# Patient Record
Sex: Female | Born: 1980
Health system: Southern US, Community
[De-identification: ages and names within clinical notes are randomized; demographics above are authoritative.]

## PROBLEM LIST (undated history)

## (undated) DIAGNOSIS — Z87442 Personal history of urinary calculi: Secondary | ICD-10-CM

## (undated) DIAGNOSIS — IMO0002 Reserved for concepts with insufficient information to code with codable children: Secondary | ICD-10-CM

## (undated) DIAGNOSIS — R7989 Other specified abnormal findings of blood chemistry: Secondary | ICD-10-CM

## (undated) DIAGNOSIS — G43909 Migraine, unspecified, not intractable, without status migrainosus: Secondary | ICD-10-CM

## (undated) DIAGNOSIS — F419 Anxiety disorder, unspecified: Secondary | ICD-10-CM

## (undated) DIAGNOSIS — F32A Anxiety disorder, unspecified: Secondary | ICD-10-CM

## (undated) DIAGNOSIS — R87629 Unspecified abnormal cytological findings in specimens from vagina: Secondary | ICD-10-CM

## (undated) DIAGNOSIS — F1120 Opioid dependence, uncomplicated: Secondary | ICD-10-CM

## (undated) DIAGNOSIS — F329 Major depressive disorder, single episode, unspecified: Secondary | ICD-10-CM

## (undated) DIAGNOSIS — G43009 Migraine without aura, not intractable, without status migrainosus: Secondary | ICD-10-CM

## (undated) DIAGNOSIS — R87619 Unspecified abnormal cytological findings in specimens from cervix uteri: Secondary | ICD-10-CM

## (undated) HISTORY — DX: Major depressive disorder, single episode, unspecified: F32.9

## (undated) HISTORY — DX: Unspecified abnormal cytological findings in specimens from cervix uteri: R87.619

## (undated) HISTORY — PX: COLPOSCOPY: SHX161

## (undated) HISTORY — DX: Reserved for concepts with insufficient information to code with codable children: IMO0002

## (undated) HISTORY — DX: Depression, unspecified: F32.A

## (undated) HISTORY — DX: Opioid dependence, uncomplicated: F11.20

## (undated) HISTORY — DX: Migraine without aura, not intractable, without status migrainosus: G43.009

## (undated) HISTORY — DX: Personal history of urinary calculi: Z87.442

## (undated) HISTORY — DX: Anxiety disorder, unspecified: F41.9

## (undated) HISTORY — DX: Unspecified abnormal cytological findings in specimens from vagina: R87.629

## (undated) HISTORY — DX: Other specified abnormal findings of blood chemistry: R79.89

---
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
## 1999-05-25 ENCOUNTER — Other Ambulatory Visit: Admission: RE | Admit: 1999-05-25 | Discharge: 1999-05-25 | Payer: Self-pay | Admitting: *Deleted

## 2000-06-22 ENCOUNTER — Other Ambulatory Visit: Admission: RE | Admit: 2000-06-22 | Discharge: 2000-06-22 | Payer: Self-pay | Admitting: *Deleted

## 2001-05-10 ENCOUNTER — Encounter: Payer: Self-pay | Admitting: Family Medicine

## 2001-05-10 ENCOUNTER — Encounter: Admission: RE | Admit: 2001-05-10 | Discharge: 2001-05-10 | Payer: Self-pay | Admitting: Family Medicine

## 2001-07-04 ENCOUNTER — Other Ambulatory Visit: Admission: RE | Admit: 2001-07-04 | Discharge: 2001-07-04 | Payer: Self-pay | Admitting: *Deleted

## 2002-07-29 ENCOUNTER — Other Ambulatory Visit: Admission: RE | Admit: 2002-07-29 | Discharge: 2002-07-29 | Payer: Self-pay | Admitting: *Deleted

## 2003-08-12 ENCOUNTER — Other Ambulatory Visit: Admission: RE | Admit: 2003-08-12 | Discharge: 2003-08-12 | Payer: Self-pay | Admitting: *Deleted

## 2004-08-25 ENCOUNTER — Other Ambulatory Visit: Admission: RE | Admit: 2004-08-25 | Discharge: 2004-08-25 | Payer: Self-pay | Admitting: *Deleted

## 2005-09-20 ENCOUNTER — Other Ambulatory Visit: Admission: RE | Admit: 2005-09-20 | Discharge: 2005-09-20 | Payer: Self-pay | Admitting: *Deleted

## 2006-02-14 HISTORY — PX: CHOLECYSTECTOMY: SHX55

## 2006-04-06 ENCOUNTER — Emergency Department (HOSPITAL_COMMUNITY): Admission: EM | Admit: 2006-04-06 | Discharge: 2006-04-06 | Payer: Self-pay | Admitting: Emergency Medicine

## 2006-04-11 ENCOUNTER — Encounter: Admission: RE | Admit: 2006-04-11 | Discharge: 2006-04-11 | Payer: Self-pay | Admitting: Physician Assistant

## 2006-06-13 ENCOUNTER — Encounter: Admission: RE | Admit: 2006-06-13 | Discharge: 2006-06-13 | Payer: Self-pay | Admitting: Family Medicine

## 2006-08-04 ENCOUNTER — Encounter (INDEPENDENT_AMBULATORY_CARE_PROVIDER_SITE_OTHER): Payer: Self-pay | Admitting: Surgery

## 2006-08-04 ENCOUNTER — Ambulatory Visit (HOSPITAL_COMMUNITY): Admission: RE | Admit: 2006-08-04 | Discharge: 2006-08-04 | Payer: Self-pay | Admitting: Surgery

## 2006-10-25 ENCOUNTER — Other Ambulatory Visit: Admission: RE | Admit: 2006-10-25 | Discharge: 2006-10-25 | Payer: Self-pay | Admitting: *Deleted

## 2008-03-10 ENCOUNTER — Emergency Department (HOSPITAL_COMMUNITY): Admission: EM | Admit: 2008-03-10 | Discharge: 2008-03-10 | Payer: Self-pay | Admitting: Emergency Medicine

## 2010-06-29 NOTE — Op Note (Signed)
NAMEJERSEE, Lauren Harvey              ACCOUNT NO.:  [id number]   MEDICAL RECORD NO.:  [id number]          PATIENT TYPE:  AMB   LOCATION:  DAY                          FACILITY:  [facility]   PHYSICIAN:  Wilmon Arms. Corliss Skains, M.D. DATE OF BIRTH:  1980/11/15   DATE OF PROCEDURE:  08/04/2006  DATE OF DISCHARGE:                               OPERATIVE REPORT   PREOPERATIVE DIAGNOSIS:  Chronic calculus cholecystitis.   POSTOPERATIVE DIAGNOSIS:  Chronic calculus cholecystitis.   PROCEDURE PERFORMED:  Laparoscopic cholecystectomy with intraoperative  cholangiogram.   SURGEON:  Wilmon Arms. Tsuei, M.D.   ANESTHESIA:  General endotracheal.   INDICATIONS:  The patient is a 30 year old female who presents with  several months of epigastric abdominal pain.  Workup included an  ultrasound showing an 11 mm gallstone.  Blood work was normal.  HIDA  scan showed a 20% gallbladder ejection fraction.  The patient was then  referred for surgical evaluation.   DESCRIPTION OF PROCEDURE:  The patient brought to the operating room,  placed in supine position on the operating table.  After adequate level  of general anesthesia was obtained, the patient's abdomen was prepped  with Betadine and draped in the sterile fashion.  A time-out was taken  to assure proper patient, proper procedure.  The patient's umbilicus was  infiltrated with 0.25% Marcaine.  A transverse incision was made below  her umbilicus.  Dissection was carried down to the fascia which was  opened vertically.  The peritoneal cavity was bluntly entered.  A stay  suture of 0 Vicryl was placed around the fascial opening.  Pneumoperitoneum is obtained by inserting a Hasson cannula and  insufflating CO2, maintaining maximal pressure of 15 mmHg.  The  laparoscope was inserted and the patient was tilted in reverse  Trendelenburg, tilted to her left.  A 10 mm port was placed in  subxiphoid position.  Two 5 mm ports placed in right upper quadrant.  The  gallbladder was grasped with a clamp and elevated at the edge of the  liver.  We opened the peritoneum around the hilum of gallbladder.  I  dissected around the cystic duct circumferentially.  This was ligated  and clipped distally.  A small opening was created on the cystic duct  and a Cook cholangiogram catheter was threaded into the cystic duct.  A  cholangiogram was obtained which showed good flow proximally and  distally in the biliary tree with no sign of filling defects or  obstruction.  Contrast flowed easily through the duodenum.  The catheter  was removed and cystic duct was ligated, clipped and divided.  The  cystic artery was ligated with clips and divided.  A posterior branch  was identified and was also ligated.  Cautery was then used to remove  the gallbladder from the liver bed.  The gallbladder was placed in  EndoCatch sac.  We reinspected the gallbladder fossa for hemostasis.  The gallbladder was then removed through the umbilical port site.  The  stay suture was tied down to close the fascia.  Irrigation was suctioned  out of right upper quadrant and  ports were  removed as pneumoperitoneum was released.  A 4-0 Monocryl was used to  close skin incisions.  Steri-Strips and Kling dressings were applied.  The patient was then extubated and brought to recovery in stable  condition.  All sponge, instrument, needle counts correct.      Wilmon Arms. Tsuei, M.D.  Electronically Signed     MKT/MEDQ  D:  08/04/2006  T:  08/04/2006  Job:  [id number]

## 2010-12-01 LAB — DIFFERENTIAL
Lymphocytes Relative: 34
Lymphs Abs: 2.9
Monocytes Absolute: 0.5
Monocytes Relative: 6
Neutro Abs: 4.9

## 2010-12-01 LAB — CBC
HCT: 38.9
Platelets: 379
RDW: 12.7

## 2010-12-01 LAB — COMPREHENSIVE METABOLIC PANEL
Albumin: 3.5
Alkaline Phosphatase: 48
BUN: 9
Creatinine, Ser: 0.97
Potassium: 4.9
Total Protein: 7

## 2010-12-01 LAB — PREGNANCY, URINE: Preg Test, Ur: NEGATIVE

## 2011-05-30 ENCOUNTER — Other Ambulatory Visit: Payer: Self-pay | Admitting: Infectious Diseases

## 2011-05-30 ENCOUNTER — Ambulatory Visit
Admission: RE | Admit: 2011-05-30 | Discharge: 2011-05-30 | Disposition: A | Discharge status: Home / Self Care | Payer: PRIVATE HEALTH INSURANCE | Source: Ambulatory Visit | Attending: Infectious Diseases | Admitting: Infectious Diseases

## 2011-05-30 DIAGNOSIS — R7611 Nonspecific reaction to tuberculin skin test without active tuberculosis: Secondary | ICD-10-CM

## 2011-08-03 ENCOUNTER — Encounter (HOSPITAL_COMMUNITY): Payer: Self-pay | Admitting: *Deleted

## 2011-08-03 ENCOUNTER — Emergency Department (HOSPITAL_COMMUNITY)
Admission: EM | Admit: 2011-08-03 | Discharge: 2011-08-03 | Disposition: A | Discharge status: Home / Self Care | Payer: 59 | Attending: Emergency Medicine | Admitting: Emergency Medicine

## 2011-08-03 DIAGNOSIS — G43909 Migraine, unspecified, not intractable, without status migrainosus: Secondary | ICD-10-CM | POA: Insufficient documentation

## 2011-08-03 DIAGNOSIS — J019 Acute sinusitis, unspecified: Secondary | ICD-10-CM

## 2011-08-03 DIAGNOSIS — Z79899 Other long term (current) drug therapy: Secondary | ICD-10-CM | POA: Insufficient documentation

## 2011-08-03 DIAGNOSIS — F17200 Nicotine dependence, unspecified, uncomplicated: Secondary | ICD-10-CM | POA: Insufficient documentation

## 2011-08-03 DIAGNOSIS — Z885 Allergy status to narcotic agent status: Secondary | ICD-10-CM | POA: Insufficient documentation

## 2011-08-03 HISTORY — DX: Migraine, unspecified, not intractable, without status migrainosus: G43.909

## 2011-08-03 MED ORDER — FLUTICASONE PROPIONATE 50 MCG/ACT NA SUSP: 2.0000 | Freq: Every day | NASAL | Status: DC

## 2011-08-03 MED ORDER — AMOXICILLIN 500 MG PO CAPS: 500.0000 mg | ORAL_CAPSULE | Freq: Three times a day (TID) | ORAL | Status: AC

## 2011-08-03 MED ORDER — METOCLOPRAMIDE HCL 5 MG/ML IJ SOLN
10.0000 mg | Freq: Once | INTRAMUSCULAR | Status: AC
  Administered 2011-08-03: 10 mg via INTRAVENOUS
  Filled 2011-08-03: qty 2

## 2011-08-03 MED ORDER — KETOROLAC TROMETHAMINE 10 MG PO TABS: 10.0000 mg | ORAL_TABLET | Freq: Four times a day (QID) | ORAL | Status: AC | PRN

## 2011-08-03 MED ORDER — DEXAMETHASONE SODIUM PHOSPHATE 10 MG/ML IJ SOLN
10.0000 mg | Freq: Once | INTRAMUSCULAR | Status: AC
  Administered 2011-08-03: 10 mg via INTRAVENOUS
  Filled 2011-08-03: qty 1

## 2011-08-03 MED ORDER — KETOROLAC TROMETHAMINE 30 MG/ML IJ SOLN
30.0000 mg | Freq: Once | INTRAMUSCULAR | Status: AC
  Administered 2011-08-03: 30 mg via INTRAVENOUS
  Filled 2011-08-03: qty 1

## 2011-08-03 MED ORDER — DIPHENHYDRAMINE HCL 50 MG/ML IJ SOLN
25.0000 mg | Freq: Once | INTRAMUSCULAR | Status: AC
  Administered 2011-08-03: 25 mg via INTRAVENOUS
  Filled 2011-08-03: qty 1

## 2011-08-03 NOTE — ED Notes (Signed)
Pt reports headache behind right eye x 4 days. Hx of migraines. States taken imitrex at home without relief. Denies associated symptoms.

## 2011-08-03 NOTE — ED Provider Notes (Signed)
Medical screening examination/treatment/procedure(s) were conducted as a shared visit with non-physician practitioner(s) and myself.  I personally evaluated the patient during the encounter Right retroorbital ha.  No fever, neck pain, rash. Normal neuro exam.  Ha increases when she leans forward.  Dx. Sinusitis.  Will tx with abxs and steroids.  Cheri Guppy, MD 08/03/11 [phone]

## 2011-08-03 NOTE — ED Notes (Signed)
Pt reports takes suboxin and would rather not have narcotics to help with pain. Father at bedside, but does not know about this. Pt asked him to step out.

## 2011-08-03 NOTE — ED Provider Notes (Signed)
History     CSN: [id number]  Arrival date & time 08/03/11  0806   First MD Initiated Contact with Patient 08/03/11 [phone]      Chief Complaint  Patient presents with  . Headache    (Consider location/radiation/quality/duration/timing/severity/associated sxs/prior treatment) Patient is a 31 y.o. female presenting with headaches. The history is provided by the patient and a parent.  Headache  The current episode started more than 2 days ago. The problem occurs constantly. The problem has been gradually worsening. The headache is associated with nothing. The pain is located in the right unilateral region. The pain is at a severity of 7/10. Pertinent negatives include no fever and no nausea.   31 y.o female iNAD c/o headache x4 days to right periorbital region. Pt has h/o migraines byut they are usually bilateral to temporal region. Pt normally has nausea accompaning her migraines and she is not currently nauseated.  Imitrex is not working.  Pt has positional cough and runny nose. Pain is exacerbated by leaning forward. Denies acute onset exacerbation with valsalva, fever, neck stiffness, N/V, SOB.   Past Medical History  Diagnosis Date  . Migraines     Past Surgical History  Procedure Date  . Cholecystectomy     No family history on file.  History  Substance Use Topics  . Smoking status: Current Everyday Smoker  . Smokeless tobacco: Not on file  . Alcohol Use: No    OB History    Grav Para Term Preterm Abortions TAB SAB Ect Mult Living                  Review of Systems  Constitutional: Negative for fever.  Gastrointestinal: Negative for nausea.  Neurological: Positive for headaches.  All other systems reviewed and are negative.    Allergies  Dilaudid  Home Medications   Current Outpatient Rx  Name Route Sig Dispense Refill  . BUPRENORPHINE HCL-NALOXONE HCL 8-2 MG SL SUBL Sublingual Place 1 tablet under the tongue 2 (two) times daily.    Marland Kitchen FLUOXETINE HCL 40  MG PO CAPS Oral Take 40 mg by mouth daily.    Marland Kitchen LEVONORGEST-ETH ESTRAD 91-DAY 0.15-0.03 MG PO TABS Oral Take 1 tablet by mouth daily.    Carma Leaven M PLUS PO TABS Oral Take 1 tablet by mouth daily.    . SUMATRIPTAN SUCCINATE 100 MG PO TABS Oral Take 100 mg by mouth every 2 (two) hours as needed. For migraines      BP 119/72  Pulse 83  Temp 98.4 F (36.9 C) (Oral)  Resp 16  SpO2 98%  Physical Exam  Vitals reviewed. Constitutional: She is oriented to person, place, and time. She appears well-developed and well-nourished. No distress.  HENT:  Head: Normocephalic and atraumatic.  Right Ear: External ear normal.  Left Ear: External ear normal.  Nose: Nose normal.  Mouth/Throat: Oropharynx is clear and moist. No oropharyngeal exudate.       No tenderness to sinuses  Eyes: Conjunctivae and EOM are normal. Pupils are equal, round, and reactive to light.  Neck: Normal range of motion. Neck supple.  Cardiovascular: Normal rate, regular rhythm and normal heart sounds.   Pulmonary/Chest: Effort normal and breath sounds normal.  Abdominal: Soft. Bowel sounds are normal.  Musculoskeletal: Normal range of motion.  Neurological: She is alert and oriented to person, place, and time.  Psychiatric: She has a normal mood and affect.    ED Course  Procedures (including critical care time)  Labs Reviewed -  No data to display No results found.   1. Acute sinus infection       MDM  31 y/o female with h/o migraines INAD c/o atypical headache, with no HA red flags. Given decdron an toradol IV with significant improvement and will start on amoxicillin for acute sinusitis. Pt verbalized understanding and agrees with care plan. Outpatient follow-up and return precautions given.         Wynetta Emery, PA-C 08/03/11 [phone]

## 2011-08-03 NOTE — ED Provider Notes (Signed)
Medical screening examination/treatment/procedure(s) were conducted as a shared visit with non-physician practitioner(s) and myself.  I personally evaluated the patient during the encounter  Jinna Weinman, MD 08/03/11 1544 

## 2011-08-03 NOTE — Discharge Instructions (Signed)
Any antibiotic can interfere with oral birth control. Please use back up method  Establish primary care, you can contact [facility] or [facility] to set up an appointment   Rest and push fluids, use nasal saline spray at least 4 times a day. Use 5-10 minutes before flonase (fluticasone)  Sinusitis Sinuses are air pockets within the bones of your face. The growth of bacteria within a sinus leads to infection. The infection prevents the sinuses from draining. This infection is called sinusitis. SYMPTOMS  There will be different areas of pain depending on which sinuses have become infected.  The maxillary sinuses often produce pain beneath the eyes.   Frontal sinusitis may cause pain in the middle of the forehead and above the eyes.  Other problems (symptoms) include:  Toothaches.   Colored, pus-like (purulent) drainage from the nose.   Swelling, warmth, and tenderness over the sinus areas may be signs of infection.  TREATMENT  Sinusitis is most often determined by an exam.X-rays may be taken. If x-rays have been taken, make sure you obtain your results or find out how you are to obtain them. Your caregiver may give you medications (antibiotics). These are medications that will help kill the bacteria causing the infection. You may also be given a medication (decongestant) that helps to reduce sinus swelling.  HOME CARE INSTRUCTIONS   Only take over-the-counter or prescription medicines for pain, discomfort, or fever as directed by your caregiver.   Drink extra fluids. Fluids help thin the mucus so your sinuses can drain more easily.   Applying either moist heat or ice packs to the sinus areas may help relieve discomfort.   Use saline nasal sprays to help moisten your sinuses. The sprays can be found at your local drugstore.  SEEK IMMEDIATE MEDICAL CARE IF:  You have a fever.   You have increasing pain, severe headaches, or toothache.   You have nausea, vomiting, or drowsiness.   You  develop unusual swelling around the face or trouble seeing.  MAKE SURE YOU:   Understand these instructions.   Will watch your condition.   Will get help right away if you are not doing well or get worse.  Document Released: 01/31/2005 Document Revised: 01/20/2011 Document Reviewed: 08/30/2006 [facility] Patient Information 2012 [place], [place].. Establish primary care. Return for any worsening of symptoms

## 2011-12-12 ENCOUNTER — Other Ambulatory Visit: Payer: Self-pay | Admitting: Family Medicine

## 2011-12-12 DIAGNOSIS — R51 Headache: Secondary | ICD-10-CM

## 2011-12-13 ENCOUNTER — Ambulatory Visit
Admission: RE | Admit: 2011-12-13 | Discharge: 2011-12-13 | Disposition: A | Discharge status: Home / Self Care | Payer: 59 | Source: Ambulatory Visit | Attending: Family Medicine | Admitting: Family Medicine

## 2011-12-13 DIAGNOSIS — R51 Headache: Secondary | ICD-10-CM

## 2012-07-11 ENCOUNTER — Encounter: Payer: Self-pay | Admitting: Certified Nurse Midwife

## 2012-07-11 ENCOUNTER — Ambulatory Visit (INDEPENDENT_AMBULATORY_CARE_PROVIDER_SITE_OTHER): Payer: 59 | Admitting: Certified Nurse Midwife

## 2012-07-11 VITALS — BP 100/60 | Ht 68.5 in | Wt 205.0 lb

## 2012-07-11 DIAGNOSIS — IMO0001 Reserved for inherently not codable concepts without codable children: Secondary | ICD-10-CM

## 2012-07-11 DIAGNOSIS — Z01419 Encounter for gynecological examination (general) (routine) without abnormal findings: Secondary | ICD-10-CM

## 2012-07-11 DIAGNOSIS — Z309 Encounter for contraceptive management, unspecified: Secondary | ICD-10-CM

## 2012-07-11 DIAGNOSIS — Z0189 Encounter for other specified special examinations: Secondary | ICD-10-CM

## 2012-07-11 MED ORDER — LEVONORGEST-ETH ESTRAD 91-DAY 0.1-0.02 & 0.01 MG PO TABS: 1.0000 | ORAL_TABLET | Freq: Every day | ORAL | Status: DC

## 2012-07-11 NOTE — Patient Instructions (Signed)
General topics  Next pap or exam is  due in 1 year Take a Women's multivitamin Take 1200 mg. of calcium daily - prefer dietary If any concerns in interim to call back  Breast Self-Awareness Practicing breast self-awareness may pick up problems early, prevent significant medical complications, and possibly save your life. By practicing breast self-awareness, you can become familiar with how your breasts look and feel and if your breasts are changing. This allows you to notice changes early. It can also offer you some reassurance that your breast health is good. One way to learn what is normal for your breasts and whether your breasts are changing is to do a breast self-exam. If you find a lump or something that was not present in the past, it is best to contact your caregiver right away. Other findings that should be evaluated by your caregiver include nipple discharge, especially if it is bloody; skin changes or reddening; areas where the skin seems to be pulled in (retracted); or new lumps and bumps. Breast pain is seldom associated with cancer (malignancy), but should also be evaluated by a caregiver. BREAST SELF-EXAM The best time to examine your breasts is 5 7 days after your menstrual period is over.  Exit[employer] Patient Information 2013 [employer].   Exercise to Stay Healthy Exercise helps you become and stay healthy. EXERCISE IDEAS AND TIPS Choose exercises that:  You enjoy.  Fit into your day. You do not need to exercise really hard to be healthy. You can do exercises at a slow or medium level and stay healthy. You can:  Stretch before and after working out.  Try yoga, Pilates, or tai chi.  Lift weights.  Walk fast, swim, jog, run, climb stairs, bicycle, dance, or rollerskate.  Take aerobic classes. Exercises that burn about 150 calories:  Running 1  miles in 15 minutes.  Playing volleyball for 45 to 60 minutes.  Washing and waxing a car for 45 to 60  minutes.  Playing touch football for 45 minutes.  Walking 1  miles in 35 minutes.  Pushing a stroller 1  miles in 30 minutes.  Playing basketball for 30 minutes.  Raking leaves for 30 minutes.  Bicycling 5 miles in 30 minutes.  Walking 2 miles in 30 minutes.  Dancing for 30 minutes.  Shoveling snow for 15 minutes.  Swimming laps for 20 minutes.  Walking up stairs for 15 minutes.  Bicycling 4 miles in 15 minutes.  Gardening for 30 to 45 minutes.  Jumping rope for 15 minutes.  Washing windows or floors for 45 to 60 minutes. Document Released: 03/05/2010 Document Revised: 04/25/2011 Document Reviewed: 03/05/2010 [employer] Patient Information 2013 [employer].   Other topics ( that may be useful information):    Sexually Transmitted Disease Sexually transmitted disease (STD) refers to any infection that is passed from person to person during sexual activity. This may happen by way of saliva, semen, blood, vaginal mucus, or urine. Common STDs include:  Gonorrhea.  Chlamydia.  Syphilis.  HIV/AIDS.  Genital herpes.  Hepatitis B and C.  Trichomonas.  Human papillomavirus (HPV).  Pubic lice. CAUSES  An STD may be spread by bacteria, virus, or parasite. A person can get an STD by:  Sexual intercourse with an infected person.  Sharing sex toys with an infected person.  Sharing needles with an infected person.  Having intimate contact with the genitals, mouth, or rectal areas of an infected person. SYMPTOMS  Some people may not have any symptoms, but   they can still pass the infection to others. Different STDs have different symptoms. Symptoms include:  Painful or bloody urination.  Pain in the pelvis, abdomen, vagina, anus, throat, or eyes.  Skin rash, itching, irritation, growths, or sores (lesions). These usually occur in the genital or anal area.  Abnormal vaginal discharge.  Penile discharge in men.  Soft, flesh-colored skin growths in the  genital or anal area.  Fever.  Pain or bleeding during sexual intercourse.  Swollen glands in the groin area.  Yellow skin and eyes (jaundice). This is seen with hepatitis. DIAGNOSIS  To make a diagnosis, your caregiver may:  Take a medical history.  Perform a physical exam.  Take a specimen (culture) to be examined.  Examine a sample of discharge under a microscope.  Perform blood test TREATMENT   Chlamydia, gonorrhea, trichomonas, and syphilis can be cured with antibiotic medicine.  Genital herpes, hepatitis, and HIV can be treated, but not cured, with prescribed medicines. The medicines will lessen the symptoms.  Genital warts from HPV can be treated with medicine or by freezing, burning (electrocautery), or surgery. Warts may come back.  HPV is a virus and cannot be cured with medicine or surgery.However, abnormal areas may be followed very closely by your caregiver and may be removed from the cervix, vagina, or vulva through office procedures or surgery. If your diagnosis is confirmed, your recent sexual partners need treatment. This is true even if they are symptom-free or have a negative culture or evaluation. They should not have sex until their caregiver says it is okay. HOME CARE INSTRUCTIONS  All sexual partners should be informed, tested, and treated for all STDs.  Take your antibiotics as directed. Finish them even if you start to feel better.  Only take over-the-counter or prescription medicines for pain, discomfort, or fever as directed by your caregiver.  Rest.  Eat a balanced diet and drink enough fluids to keep your urine clear or pale yellow.  Do not have sex until treatment is completed and you have followed up with your caregiver. STDs should be checked after treatment.  Keep all follow-up appointments, Pap tests, and blood tests as directed by your caregiver.  Only use latex condoms and water-soluble lubricants during sexual activity. Do not use  petroleum jelly or oils.  Avoid alcohol and illegal drugs.  Get vaccinated for HPV and hepatitis. If you have not received these vaccines in the past, talk to your caregiver about whether one or both might be right for you.  Avoid risky sex practices that can break the skin. The only way to avoid getting an STD is to avoid all sexual activity.Latex condoms and dental dams (for oral sex) will help lessen the risk of getting an STD, but will not completely eliminate the risk. SEEK MEDICAL CARE IF:   You have a fever.  You have any new or worsening symptoms. Document Released: 04/23/2002 Document Revised: 04/25/2011 Document Reviewed: 04/30/2010 Exit[employer] Patient Information 2013 [employer].    Domestic Abuse You are being battered or abused if someone close to you hits, pushes, or physically hurts you in any way. You also are being abused if you are forced into activities. You are being sexually abused if you are forced to have sexual contact of any kind. You are being emotionally abused if you are made to feel worthless or if you are constantly threatened. It is important to remember that help is available. No one has the right to abuse you. PREVENTION OF FURTHER   ABUSE  Learn the warning signs of danger. This varies with situations but may include: the use of alcohol, threats, isolation from friends and family, or forced sexual contact. Leave if you feel that violence is going to occur.  If you are attacked or beaten, report it to the police so the abuse is documented. You do not have to press charges. The police can protect you while you or the attackers are leaving. Get the officer's name and badge number and a copy of the report.  Find someone you can trust and tell them what is happening to you: your caregiver, a nurse, clergy member, close friend or family member. Feeling ashamed is natural, but remember that you have done nothing wrong. No one deserves abuse. Document Released:  01/29/2000 Document Revised: 04/25/2011 Document Reviewed: 04/08/2010 Exit[employer] Patient Information 2013 [employer].    How Much is Too Much Alcohol? Drinking too much alcohol can cause injury, accidents, and health problems. These types of problems can include:   Car crashes.  Falls.  Family fighting (domestic violence).  Drowning.  Fights.  Injuries.  Burns.  Damage to certain organs.  Having a baby with birth defects. ONE DRINK CAN BE TOO MUCH WHEN YOU ARE:  Working.  Pregnant or breastfeeding.  Taking medicines. Ask your doctor.  Driving or planning to drive. If you or someone you know has a drinking problem, get help from a doctor.  Document Released: 11/27/2008 Document Revised: 04/25/2011 Document Reviewed: 11/27/2008 Exit[employer] Patient Information 2013 [employer].   Smoking Hazards Smoking cigarettes is extremely bad for your health. Tobacco smoke has over 200 known poisons in it. There are over 60 chemicals in tobacco smoke that cause cancer. Some of the chemicals found in cigarette smoke include:   Cyanide.  Benzene.  Formaldehyde.  Methanol (wood alcohol).  Acetylene (fuel used in welding torches).  Ammonia. Cigarette smoke also contains the poisonous gases nitrogen oxide and carbon monoxide.  Cigarette smokers have an increased risk of many serious medical problems and Smoking causes approximately:  90% of all lung cancer deaths in men.  80% of all lung cancer deaths in women.  90% of deaths from chronic obstructive lung disease. Compared with nonsmokers, smoking increases the risk of:  Coronary heart disease by 2 to 4 times.  Stroke by 2 to 4 times.  Men developing lung cancer by 23 times.  Women developing lung cancer by 13 times.  Dying from chronic obstructive lung diseases by 12 times.  . Smoking is the most preventable cause of death and disease in our society.  WHY IS SMOKING ADDICTIVE?  Nicotine is the chemical  agent in tobacco that is capable of causing addiction or dependence.  When you smoke and inhale, nicotine is absorbed rapidly into the bloodstream through your lungs. Nicotine absorbed through the lungs is capable of creating a powerful addiction. Both inhaled and non-inhaled nicotine may be addictive.  Addiction studies of cigarettes and spit tobacco show that addiction to nicotine occurs mainly during the teen years, when young people begin using tobacco products. WHAT ARE THE BENEFITS OF QUITTING?  There are many health benefits to quitting smoking.   Likelihood of developing cancer and heart disease decreases. Health improvements are seen almost immediately.  Blood pressure, pulse rate, and breathing patterns start returning to normal soon after quitting. QUITTING SMOKING   American Lung Association - 1-800-LUNGUSA  American Cancer Society - 1-800-ACS-2345 Document Released: 03/10/2004 Document Revised: 04/25/2011 Document Reviewed: 11/12/2008 ExitCare Patient Information 2013 [employer].   Stress Management Stress is a state of physical or mental tension that often results from changes in your life or normal routine. Some common causes of stress are:  Death of a loved one.  Injuries or severe illnesses.  Getting fired or changing jobs.  Moving into a new home. Other causes may be:  Sexual problems.  Business or financial losses.  Taking on a large debt.  Regular conflict with someone at home or at work.  Constant tiredness from lack of sleep. It is not just bad things that are stressful. It may be stressful to:  Win the lottery.  Get married.  Buy a new car. The amount of stress that can be easily tolerated varies from person to person. Changes generally cause stress, regardless of the types of change. Too much stress can affect your health. It may lead to physical or emotional problems. Too little stress (boredom) may also become stressful. SUGGESTIONS TO  REDUCE STRESS:  Talk things over with your family and friends. It often is helpful to share your concerns and worries. If you feel your problem is serious, you may want to get help from a professional counselor.  Consider your problems one at a time instead of lumping them all together. Trying to take care of everything at once may seem impossible. List all the things you need to do and then start with the most important one. Set a goal to accomplish 2 or 3 things each day. If you expect to do too many in a single day you will naturally fail, causing you to feel even more stressed.  Do not use alcohol or drugs to relieve stress. Although you may feel better for a short time, they do not remove the problems that caused the stress. They can also be habit forming.  Exercise regularly - at least 3 times per week. Physical exercise can help to relieve that "uptight" feeling and will relax you.  The shortest distance between despair and hope is often a good night's sleep.  Go to bed and get up on time allowing yourself time for appointments without being rushed.  Take a short "time-out" period from any stressful situation that occurs during the day. Close your eyes and take some deep breaths. Starting with the muscles in your face, tense them, hold it for a few seconds, then relax. Repeat this with the muscles in your neck, shoulders, hand, stomach, back and legs.  Take good care of yourself. Eat a balanced diet and get plenty of rest.  Schedule time for having fun. Take a break from your daily routine to relax. HOME CARE INSTRUCTIONS   Call if you feel overwhelmed by your problems and feel you can no longer manage them on your own.  Return immediately if you feel like hurting yourself or someone else. Document Released: 07/27/2000 Document Revised: 04/25/2011 Document Reviewed: 03/19/2007 ExitCare Patient Information 2013 [employer].   

## 2012-07-11 NOTE — Progress Notes (Signed)
32 y.o. G0P0000 Single Caucasian Fe here for annual. Getting married exam.  Periods none on extended cycle  OCP.  No health issues today.   Patient's last menstrual period was 01/15/2012.          Sexually active: yes  The current method of family planning is OCP (estrogen/progesterone).    Exercising: no  exercise Smoker:  E-cig  Health Maintenance: Pap:  06-13-11 neg MMG:  none Colonoscopy:  none BMD:   none TDaP:  2009 Labs: Upt-neg Self breast exam: not done   reports that she has quit smoking. She does not have any smokeless tobacco history on file. She reports that she does not drink alcohol or use illicit drugs.  Past Medical History  Diagnosis Date  . Migraines   . Anxiety and depression   . Abnormal Pap smear     Past Surgical History  Procedure Laterality Date  . Cholecystectomy  2008    Current Outpatient Prescriptions  Medication Sig Dispense Refill  . buprenorphine-naloxone (SUBOXONE) 8-2 MG SUBL Place 1 tablet under the tongue 2 (two) times daily.      . SUMAtriptan (IMITREX) 100 MG tablet Take 100 mg by mouth every 2 (two) hours as needed. For migraines      . Vilazodone HCl (VIIBRYD) 40 MG TABS Take by mouth daily.       No current facility-administered medications for this visit.    Family History  Problem Relation Age of Onset  . ALS Mother   . Hypertension Maternal Aunt   . Heart disease Maternal Grandfather     ROS:  Pertinent items are noted in HPI.  Otherwise, a comprehensive ROS was negative.  Exam:   BP 100/60  Ht 5' 8.5" (1.74 m)  Wt 205 lb (92.987 kg)  BMI 30.71 kg/m2  LMP 01/15/2012 Height: 5' 8.5" (174 cm)  Ht Readings from Last 3 Encounters:  07/11/12 5' 8.5" (1.74 m)    General appearance: alert, cooperative and appears stated age Head: Normocephalic, without obvious abnormality, atraumatic Neck: no adenopathy, supple, symmetrical, trachea midline and thyroid normal to inspection and palpation Lungs: clear to auscultation  bilaterally Breasts: normal appearance, no masses or tenderness, No nipple retraction or dimpling, No nipple discharge or bleeding, No axillary or supraclavicular adenopathy Heart: regular rate and rhythm Abdomen: soft, non-tender; no masses,  no organomegaly Extremities: extremities normal, atraumatic, no cyanosis or edema Skin: Skin color, texture, turgor normal. No rashes or lesions Lymph nodes: Cervical, supraclavicular, and axillary nodes normal. No abnormal inguinal nodes palpated Neurologic: Grossly normal   Pelvic: External genitalia:  no lesions              Urethra:  normal appearing urethra with no masses, tenderness or lesions              Bartholin's and Skene's: normal                 Vagina: normal appearing vagina with normal color and discharge, no lesions              Cervix: normal, non tender              Pap taken: yes Bimanual Exam:  Uterus:  normal size, contour, position, consistency, mobility, non-tender and anteverted              Adnexa: normal adnexa and no mass, fullness, tenderness               Rectovaginal: Confirms  Anus:  Deferred exam  A:  Well Woman with normal exam  Contraception : OCP  History of abnormal Pap  P:  Reviewed health and wellness pertinent to exam   Pap smear as per guidelines   pap smear taken today  Rx LoSeasonique see order  counseled on breast self exam, adequate intake of calcium and vitamin D, diet and exercise Wished well with wedding  return annually or prn  An After Visit Summary was printed and given to the patient.  Reviewed, TL

## 2012-07-13 ENCOUNTER — Ambulatory Visit: Payer: Self-pay | Admitting: Obstetrics & Gynecology

## 2012-07-18 LAB — IPS PAP TEST WITH HPV

## 2013-02-14 NOTE — L&D Delivery Note (Signed)
Delivery Note At 2:33 PM a viable and healthy female was delivered via Vaginal, Spontaneous Delivery (Presentation: ; Occiput Anterior).  APGAR: 8, 9; weight  .   Placenta status: Intact, Spontaneous.  Cord: 3 vessels with the following complications: None.  Cord pH: na  Anesthesia: Epidural  Episiotomy: None Lacerations: 2nd degree Suture Repair: 2.0 3.0 vicryl rapide Est. Blood Loss (mL): 200  Mom to postpartum.  Baby to Couplet care / Skin to Skin.  Lauren Harvey 01/14/2014, 3:16 PM

## 2013-05-08 ENCOUNTER — Telehealth: Payer: Self-pay | Admitting: Obstetrics & Gynecology

## 2013-05-08 NOTE — Telephone Encounter (Signed)
Patient just learned she is pregnant and is not sure if she needs an appointment.

## 2013-05-08 NOTE — Telephone Encounter (Signed)
Spoke with patient. Patient states she took at home pregnancy test last night and early this morning both were positive. LMP 2/21.Would like an office visit with Dr.Miller to confirm pregnancy and to discuss further care. Requesting afternoon appointment. Appointment made for 14:30 on 4/1 with Dr.Miller. Patient verbalizes understanding and agreeable to time and date. Will call back with any further questions or concerns.  Routing to provider for final review. Patient agreeable to disposition. Will close encounter

## 2013-05-15 ENCOUNTER — Encounter: Payer: Self-pay | Admitting: Obstetrics & Gynecology

## 2013-05-15 ENCOUNTER — Ambulatory Visit (INDEPENDENT_AMBULATORY_CARE_PROVIDER_SITE_OTHER): Payer: 59 | Admitting: Obstetrics & Gynecology

## 2013-05-15 VITALS — BP 102/60 | HR 64 | Resp 16 | Ht 69.5 in | Wt 223.0 lb

## 2013-05-15 DIAGNOSIS — N912 Amenorrhea, unspecified: Secondary | ICD-10-CM

## 2013-05-15 LAB — POCT URINE PREGNANCY: Preg Test, Ur: POSITIVE

## 2013-05-15 NOTE — Progress Notes (Signed)
Pelvic U/S scheduled and patient aware/agreeable to time.  05/23/13 at 1500 Patient verbalized understanding of the U/S appointment cancellation policy. Advised will need to cancel within 72 business hours (3 business days) or will have $100.00 no show fee placed to account.

## 2013-05-15 NOTE — Progress Notes (Signed)
33 yo G1 MWF here for confirmation of pregnancy.  Got married in August.  Started trying after Christmas.  LMP 04/06/13.  EDC 01/10/14.  Having lots of breast tenderness.  Fatigue is surprising to her it's so significant.  Mild and occasional nausea.  Taking PNV.  Has had a little spotting.  No cramping.  Has not had rubella testing.  Tdap due this year.  Last one 2005.  Has cats.  Advised no changing kitty litter.   CMV risk low as not around young children.  Had chicken pox.  Aware flu vaccine safe and recommended.   Fish/shellfish/mercury discuss.  Patient does not eat any fish except salmon.  Unpasteurized cheese/juices discussed.  Nitrites in foods disucssed.  Exercise and intercourse discussed.  H/O smoking.  Has been trying to quit for about a year.  Using no nicotine vapor "cigarettes".  She has been working towards smoking cessation for several months.  She is now not using any products with nicotine.  Next step will be to stop using inhaling device which is now just a habit for her.    Psychiatrist, [name].  Has already seen him to discuss her medications.  He is weaning her off Strong.  Wellbutrin dose has been lowered to 150mg XL.  She has follow up scheduled.  Assessment:  Amenorrhea  Plan:  Viability PUS ABO Rh today  ~25 minutes spent with patient >50% of time was in face to face discussion of above.

## 2013-05-16 ENCOUNTER — Telehealth: Payer: Self-pay | Admitting: Obstetrics & Gynecology

## 2013-05-16 LAB — ABO AND RH: Rh Type: POSITIVE

## 2013-05-16 NOTE — Telephone Encounter (Signed)
Routing to Dr. Sabra Heck for advice.  ACOG stated range from 400iu-2,000IU from what I could find. What do you suggest?

## 2013-05-16 NOTE — Telephone Encounter (Signed)
Message copied by Michele Mcalpine on Thu May 16, 2013  5:10 PM ------      Message from: Megan Salon      Created: Thu May 16, 2013  4:44 PM       Inform blood type A+.  Doesn't need Rhogam.  Has spotted a little in early pregnancy. ------

## 2013-05-16 NOTE — Telephone Encounter (Signed)
Spoke with patient and message from Dr. Sabra Heck given.   Advised patient to continue with prenatal vitamins and dose in prenatals should be sufficient for Vitamin D and would call her if Dr. Sabra Heck had any further instructions regarding Vitamin D.

## 2013-05-16 NOTE — Telephone Encounter (Signed)
Patient is asking how much vitamin d is safe to take while pregnant?

## 2013-05-17 NOTE — Telephone Encounter (Signed)
Can close encounter after pt call.  Has viability scan scheduled.

## 2013-05-17 NOTE — Telephone Encounter (Signed)
She does not need any extra Vit D--this can cause problems so a prenatal vitamin is all she needs.

## 2013-05-20 NOTE — Telephone Encounter (Signed)
Spoke with patient. Message from [place] given. Advised patient to continue with prenatal vitamins as this should be sufficient for Vitamin D. No extra Vitamin D is needed. Patient agreeable and verbalizes understanding.  Routing to provider for final review. Patient agreeable to disposition. Will close encounter.

## 2013-05-23 ENCOUNTER — Other Ambulatory Visit: Payer: Self-pay | Admitting: Obstetrics & Gynecology

## 2013-05-23 ENCOUNTER — Ambulatory Visit (INDEPENDENT_AMBULATORY_CARE_PROVIDER_SITE_OTHER): Payer: 59

## 2013-05-23 ENCOUNTER — Ambulatory Visit (INDEPENDENT_AMBULATORY_CARE_PROVIDER_SITE_OTHER): Payer: 59 | Admitting: Obstetrics & Gynecology

## 2013-05-23 VITALS — BP 104/60 | Ht 68.5 in | Wt 220.0 lb

## 2013-05-23 DIAGNOSIS — N912 Amenorrhea, unspecified: Secondary | ICD-10-CM

## 2013-05-23 DIAGNOSIS — D259 Leiomyoma of uterus, unspecified: Secondary | ICD-10-CM

## 2013-05-23 DIAGNOSIS — D219 Benign neoplasm of connective and other soft tissue, unspecified: Secondary | ICD-10-CM

## 2013-05-23 LAB — US OB TRANSVAGINAL

## 2013-05-23 MED ORDER — VILAZODONE HCL 40 MG PO TABS: 20.0000 mg | ORAL_TABLET | Freq: Every day | ORAL | Status: DC

## 2013-05-23 NOTE — Progress Notes (Signed)
33 y.o. G1P0 MWF here for a pelvic ultrasound.  LMP 04/06/13 with EGA of 6 5/7 weeks.  Pt reports she gave LMP of 2/21 although this was just spotting.  Real bleeding didn't start until 2/23.  Has had some spotting.  ABO is A+/  FINDINGS: UTERUS: normal gestational sac and yolk sac.  CRL is 0.39cm.  This is consistent with EGA of 6 1/7.  EDC by U/S is 01/15/14. Multiple fibroids are present, both intramural and subserosal.  Largest is 1.8cm.   ADNEXA:   Left ovary normal without cysts or masses   Right ovary with 30 x 57mm corpus luteal cyst CUL DE SAC: no free fluid.  D/W pt findings on ultrasound.  Given pt's information about LMP and u/s today, I would chose U/S for dating criteria with EGA today of 6 1/7 weeks and EDC of 01/15/14.  Pt aware that this is ultimatley a decision her ob will make.  Pt continues to work on weaning off of [place].  On 150mg  of Wellbutrin XL.  Staying in close contact with psychiatrist.  She can feel a different on the lower (20mg ) dosage of the Viibryd.  Still doing well and still wants to continue to discontinue this medication altogether.  On PRV.   Practices for transfer of care discussed.  She is going to go to [place] most likely as had good friend who went there and was very happy with care.  Assessment:  Viable single IUD at 6 1/7 week by u/s Plan: Transfer of care.  Pt will call once appt made for transfer of records.  Wished well with pregnancy.  ~15 minutes spent with patient >50% of time was in face to face discussion of above.

## 2013-05-24 ENCOUNTER — Encounter: Payer: Self-pay | Admitting: Obstetrics & Gynecology

## 2013-05-24 DIAGNOSIS — D219 Benign neoplasm of connective and other soft tissue, unspecified: Secondary | ICD-10-CM | POA: Insufficient documentation

## 2013-05-24 DIAGNOSIS — N912 Amenorrhea, unspecified: Secondary | ICD-10-CM | POA: Insufficient documentation

## 2013-05-31 ENCOUNTER — Telehealth: Payer: Self-pay | Admitting: Obstetrics & Gynecology

## 2013-05-31 NOTE — Telephone Encounter (Signed)
Left message for pt to call regarding records release. Does she want all for a fee or just the last two?

## 2013-06-03 NOTE — Telephone Encounter (Signed)
Will send records pertaining to pregnancy and colposcopy report to [facility] per request.

## 2013-06-03 NOTE — Telephone Encounter (Signed)
Returning a call to [facility].

## 2013-06-13 LAB — OB RESULTS CONSOLE RUBELLA ANTIBODY, IGM: Rubella: IMMUNE

## 2013-06-13 LAB — OB RESULTS CONSOLE RPR: RPR: NONREACTIVE

## 2013-06-13 LAB — OB RESULTS CONSOLE HEPATITIS B SURFACE ANTIGEN: Hepatitis B Surface Ag: NEGATIVE

## 2013-06-13 LAB — OB RESULTS CONSOLE ABO/RH: RH TYPE: POSITIVE

## 2013-06-13 LAB — OB RESULTS CONSOLE HIV ANTIBODY (ROUTINE TESTING): HIV: NONREACTIVE

## 2013-06-13 LAB — OB RESULTS CONSOLE GC/CHLAMYDIA
Chlamydia: NEGATIVE
GC PROBE AMP, GENITAL: NEGATIVE

## 2013-08-01 ENCOUNTER — Ambulatory Visit: Payer: 59 | Admitting: Obstetrics & Gynecology

## 2013-11-14 ENCOUNTER — Inpatient Hospital Stay (HOSPITAL_COMMUNITY): Admission: AD | Admit: 2013-11-14 | Payer: 59 | Source: Ambulatory Visit | Admitting: Obstetrics and Gynecology

## 2013-12-16 ENCOUNTER — Encounter: Payer: Self-pay | Admitting: Obstetrics & Gynecology

## 2014-01-08 ENCOUNTER — Encounter (HOSPITAL_COMMUNITY): Payer: Self-pay | Admitting: *Deleted

## 2014-01-08 ENCOUNTER — Telehealth (HOSPITAL_COMMUNITY): Payer: Self-pay | Admitting: *Deleted

## 2014-01-08 LAB — OB RESULTS CONSOLE GBS: STREP GROUP B AG: NEGATIVE

## 2014-01-08 NOTE — Telephone Encounter (Signed)
Preadmission screen  

## 2014-01-13 ENCOUNTER — Inpatient Hospital Stay (HOSPITAL_COMMUNITY)
Admission: RE | Admit: 2014-01-13 | Discharge: 2014-01-13 | Disposition: A | Discharge status: Home / Self Care | Payer: 59 | Source: Ambulatory Visit | Attending: Obstetrics and Gynecology | Admitting: Obstetrics and Gynecology

## 2014-01-13 ENCOUNTER — Inpatient Hospital Stay (HOSPITAL_COMMUNITY)
Admission: AD | Admit: 2014-01-13 | Discharge: 2014-01-16 | DRG: 774 | Disposition: A | Discharge status: Home / Self Care | Payer: 59 | Source: Ambulatory Visit | Attending: Obstetrics and Gynecology | Admitting: Obstetrics and Gynecology

## 2014-01-13 ENCOUNTER — Other Ambulatory Visit: Payer: Self-pay | Admitting: Obstetrics and Gynecology

## 2014-01-13 ENCOUNTER — Encounter (HOSPITAL_COMMUNITY): Payer: Self-pay

## 2014-01-13 DIAGNOSIS — Z87442 Personal history of urinary calculi: Secondary | ICD-10-CM | POA: Diagnosis not present

## 2014-01-13 DIAGNOSIS — Z8249 Family history of ischemic heart disease and other diseases of the circulatory system: Secondary | ICD-10-CM

## 2014-01-13 DIAGNOSIS — O9832 Other infections with a predominantly sexual mode of transmission complicating childbirth: Secondary | ICD-10-CM | POA: Diagnosis present

## 2014-01-13 DIAGNOSIS — Z87891 Personal history of nicotine dependence: Secondary | ICD-10-CM | POA: Diagnosis not present

## 2014-01-13 DIAGNOSIS — F53 Postpartum depression: Secondary | ICD-10-CM

## 2014-01-13 DIAGNOSIS — A630 Anogenital (venereal) warts: Secondary | ICD-10-CM | POA: Diagnosis present

## 2014-01-13 DIAGNOSIS — D649 Anemia, unspecified: Secondary | ICD-10-CM | POA: Diagnosis present

## 2014-01-13 DIAGNOSIS — Z3A40 40 weeks gestation of pregnancy: Secondary | ICD-10-CM | POA: Diagnosis present

## 2014-01-13 DIAGNOSIS — F1121 Opioid dependence, in remission: Secondary | ICD-10-CM | POA: Diagnosis present

## 2014-01-13 DIAGNOSIS — F1120 Opioid dependence, uncomplicated: Secondary | ICD-10-CM | POA: Diagnosis present

## 2014-01-13 DIAGNOSIS — O480 Post-term pregnancy: Secondary | ICD-10-CM | POA: Diagnosis present

## 2014-01-13 DIAGNOSIS — F329 Major depressive disorder, single episode, unspecified: Secondary | ICD-10-CM | POA: Diagnosis present

## 2014-01-13 DIAGNOSIS — O99323 Drug use complicating pregnancy, third trimester: Secondary | ICD-10-CM | POA: Diagnosis present

## 2014-01-13 DIAGNOSIS — O99345 Other mental disorders complicating the puerperium: Secondary | ICD-10-CM | POA: Diagnosis present

## 2014-01-13 DIAGNOSIS — O99343 Other mental disorders complicating pregnancy, third trimester: Secondary | ICD-10-CM | POA: Diagnosis present

## 2014-01-13 DIAGNOSIS — Z9889 Other specified postprocedural states: Secondary | ICD-10-CM | POA: Diagnosis present

## 2014-01-13 DIAGNOSIS — O9902 Anemia complicating childbirth: Secondary | ICD-10-CM | POA: Diagnosis present

## 2014-01-13 LAB — CBC
HCT: 36.1 % (ref 36.0–46.0)
Hemoglobin: 12.2 g/dL (ref 12.0–15.0)
MCH: 31.4 pg (ref 26.0–34.0)
MCHC: 33.8 g/dL (ref 30.0–36.0)
MCV: 93 fL (ref 78.0–100.0)
Platelets: 318 10*3/uL (ref 150–400)
RBC: 3.88 MIL/uL (ref 3.87–5.11)
RDW: 13.5 % (ref 11.5–15.5)
WBC: 12.6 10*3/uL — AB (ref 4.0–10.5)

## 2014-01-13 LAB — TYPE AND SCREEN
ABO/RH(D): A POS
ANTIBODY SCREEN: NEGATIVE

## 2014-01-13 MED ORDER — LACTATED RINGERS IV SOLN
INTRAVENOUS | Status: DC
  Administered 2014-01-13 – 2014-01-14 (×3): via INTRAVENOUS

## 2014-01-13 MED ORDER — FENTANYL 2.5 MCG/ML BUPIVACAINE 1/10 % EPIDURAL INFUSION (WH - ANES)
14.0000 mL/h | INTRAMUSCULAR | Status: DC | PRN
  Administered 2014-01-14 (×3): 14 mL/h via EPIDURAL
  Filled 2014-01-13 (×3): qty 125

## 2014-01-13 MED ORDER — EPHEDRINE 5 MG/ML INJ
10.0000 mg | INTRAVENOUS | Status: DC | PRN
  Filled 2014-01-13: qty 2

## 2014-01-13 MED ORDER — LACTATED RINGERS IV SOLN
500.0000 mL | Freq: Once | INTRAVENOUS | Status: AC
  Administered 2014-01-14: 500 mL via INTRAVENOUS

## 2014-01-13 MED ORDER — OXYTOCIN 40 UNITS IN LACTATED RINGERS INFUSION - SIMPLE MED
1.0000 m[IU]/min | INTRAVENOUS | Status: DC
  Administered 2014-01-14: 2 m[IU]/min via INTRAVENOUS
  Filled 2014-01-13: qty 1000

## 2014-01-13 MED ORDER — PHENYLEPHRINE 40 MCG/ML (10ML) SYRINGE FOR IV PUSH (FOR BLOOD PRESSURE SUPPORT)
80.0000 ug | PREFILLED_SYRINGE | INTRAVENOUS | Status: DC | PRN
  Filled 2014-01-13: qty 2
  Filled 2014-01-13: qty 10

## 2014-01-13 MED ORDER — OXYTOCIN 40 UNITS IN LACTATED RINGERS INFUSION - SIMPLE MED: 62.5000 mL/h | INTRAVENOUS | Status: DC

## 2014-01-13 MED ORDER — DIPHENHYDRAMINE HCL 50 MG/ML IJ SOLN: 12.5000 mg | INTRAMUSCULAR | Status: DC | PRN

## 2014-01-13 MED ORDER — LIDOCAINE HCL (PF) 1 % IJ SOLN
30.0000 mL | INTRAMUSCULAR | Status: DC | PRN
  Filled 2014-01-13: qty 30

## 2014-01-13 MED ORDER — ACETAMINOPHEN 325 MG PO TABS: 650.0000 mg | ORAL_TABLET | ORAL | Status: DC | PRN

## 2014-01-13 MED ORDER — MISOPROSTOL 25 MCG QUARTER TABLET
25.0000 ug | ORAL_TABLET | ORAL | Status: DC | PRN
  Administered 2014-01-13 – 2014-01-14 (×2): 25 ug via VAGINAL
  Filled 2014-01-13: qty 0.25
  Filled 2014-01-13: qty 1
  Filled 2014-01-13: qty 0.25

## 2014-01-13 MED ORDER — ZOLPIDEM TARTRATE 5 MG PO TABS: 5.0000 mg | ORAL_TABLET | Freq: Every evening | ORAL | Status: DC | PRN

## 2014-01-13 MED ORDER — TERBUTALINE SULFATE 1 MG/ML IJ SOLN: 0.2500 mg | Freq: Once | INTRAMUSCULAR | Status: AC | PRN

## 2014-01-13 MED ORDER — ONDANSETRON HCL 4 MG/2ML IJ SOLN: 4.0000 mg | Freq: Four times a day (QID) | INTRAMUSCULAR | Status: DC | PRN

## 2014-01-13 MED ORDER — CITRIC ACID-SODIUM CITRATE 334-500 MG/5ML PO SOLN: 30.0000 mL | ORAL | Status: DC | PRN

## 2014-01-13 MED ORDER — LACTATED RINGERS IV SOLN
500.0000 mL | INTRAVENOUS | Status: DC | PRN
  Administered 2014-01-14: 500 mL via INTRAVENOUS

## 2014-01-13 MED ORDER — FLEET ENEMA 7-19 GM/118ML RE ENEM: 1.0000 | ENEMA | RECTAL | Status: DC | PRN

## 2014-01-13 MED ORDER — PHENYLEPHRINE 40 MCG/ML (10ML) SYRINGE FOR IV PUSH (FOR BLOOD PRESSURE SUPPORT)
80.0000 ug | PREFILLED_SYRINGE | INTRAVENOUS | Status: DC | PRN
  Filled 2014-01-13: qty 2

## 2014-01-13 MED ORDER — OXYTOCIN BOLUS FROM INFUSION
500.0000 mL | INTRAVENOUS | Status: DC
  Administered 2014-01-14: 500 mL via INTRAVENOUS

## 2014-01-13 NOTE — H&P (Signed)
Lauren Harvey is a 33 y.o. female presenting for 40 week controlled IOL with previous history of opiate addiction.  Maternal Medical History:  Contractions: Onset was less than 1 hour ago.   Frequency: irregular and rare.   Perceived severity is mild.    Fetal activity: Perceived fetal activity is normal.   Last perceived fetal movement was within the past hour.    Prenatal complications: Substance abuse.   Prenatal Complications - Diabetes: none.    OB History    Gravida Para Term Preterm AB TAB SAB Ectopic Multiple Living   1 0 0 0 0 0 0 0 0 0      Past Medical History  Diagnosis Date  . Migraines   . Anxiety and depression   . Abnormal Pap smear     ASCUS  . History of kidney stones   . Abnormal TSH   . Opiate addiction     history occasional suboxone  . Vaginal Pap smear, abnormal    Past Surgical History  Procedure Laterality Date  . Cholecystectomy  2008  . Colposcopy      negative   Family History: family history includes ALS in her mother; Heart disease in her maternal grandfather; Hypertension in her father and maternal aunt; Migraines in her maternal grandmother and mother; Urolithiasis in her maternal aunt. Social History:  reports that she quit smoking about 9 months ago. She has never used smokeless tobacco. She reports that she does not drink alcohol or use illicit drugs.   Prenatal Transfer Tool  Maternal Diabetes: No Genetic Screening: Normal Maternal Ultrasounds/Referrals: Normal Fetal Ultrasounds or other Referrals:  None Maternal Substance Abuse:  Yes:  Type: Other: suboxone Significant Maternal Medications:  Meds include: Other: wellbutrin, viibryd Significant Maternal Lab Results:  None Other Comments:  None  Review of Systems  All other systems reviewed and are negative.     Blood pressure 136/83, pulse 76, temperature 98.1 F (36.7 C), temperature source Oral, resp. rate 16, height 5' 8.5" (1.74 m), weight 108.863 kg (240 lb), last  menstrual period 04/06/2013. Maternal Exam:  Uterine Assessment: Contraction strength is mild.  Contraction frequency is rare.   Abdomen: Patient reports no abdominal tenderness. Fetal presentation: vertex  Introitus: Normal vulva. Normal vagina.  Ferning test: not done.  Nitrazine test: not done. Amniotic fluid character: not assessed.  Pelvis: adequate for delivery.   Cervix: Cervix evaluated by digital exam.     Physical Exam  Nursing note and vitals reviewed. Constitutional: She is oriented to person, place, and time. She appears well-developed and well-nourished.  HENT:  Head: Normocephalic and atraumatic.  Neck: Normal range of motion. Neck supple.  Cardiovascular: Normal rate and regular rhythm.   Respiratory: Effort normal and breath sounds normal.  GI: Soft. Bowel sounds are normal.  Genitourinary: Vagina normal and uterus normal.  Musculoskeletal: Normal range of motion.  Neurological: She is alert and oriented to person, place, and time. She has normal reflexes.  Skin: Skin is warm and dry.  Psychiatric: She has a normal mood and affect.    Prenatal labs: ABO, Rh: A/Positive/-- (04/30 0000) Antibody:   Rubella: Immune (04/30 0000) RPR: Nonreactive (04/30 0000)  HBsAg: Negative (04/30 0000)  HIV: Non-reactive (04/30 0000)  GBS: Negative (11/25 0000)   Assessment/Plan: Postterm IUP HIstory of opiate addiction Complicated Depression  Cytotec Pitocin in am Epidural prn   Tomoki Lucken J 01/13/2014, 8:07 PM

## 2014-01-14 ENCOUNTER — Inpatient Hospital Stay (HOSPITAL_COMMUNITY): Payer: 59 | Admitting: Anesthesiology

## 2014-01-14 ENCOUNTER — Encounter (HOSPITAL_COMMUNITY): Payer: Self-pay | Admitting: *Deleted

## 2014-01-14 LAB — RPR

## 2014-01-14 LAB — ABO/RH: ABO/RH(D): A POS

## 2014-01-14 MED ORDER — PRENATAL MULTIVITAMIN CH
1.0000 | ORAL_TABLET | Freq: Every day | ORAL | Status: DC
  Filled 2014-01-14 (×2): qty 1

## 2014-01-14 MED ORDER — VILAZODONE HCL 20 MG PO TABS
10.0000 mg | ORAL_TABLET | Freq: Every day | ORAL | Status: DC
  Administered 2014-01-14 – 2014-01-15 (×2): 10 mg via ORAL

## 2014-01-14 MED ORDER — METHYLERGONOVINE MALEATE 0.2 MG PO TABS
0.2000 mg | ORAL_TABLET | ORAL | Status: DC | PRN
Start: 2014-01-14 — End: 2014-01-16

## 2014-01-14 MED ORDER — BENZOCAINE-MENTHOL 20-0.5 % EX AERO
1.0000 "application " | INHALATION_SPRAY | CUTANEOUS | Status: DC | PRN
  Filled 2014-01-14: qty 56

## 2014-01-14 MED ORDER — ONDANSETRON HCL 4 MG PO TABS: 4.0000 mg | ORAL_TABLET | ORAL | Status: DC | PRN

## 2014-01-14 MED ORDER — LANOLIN HYDROUS EX OINT: TOPICAL_OINTMENT | CUTANEOUS | Status: DC | PRN

## 2014-01-14 MED ORDER — ONDANSETRON HCL 4 MG/2ML IJ SOLN: 4.0000 mg | INTRAMUSCULAR | Status: DC | PRN

## 2014-01-14 MED ORDER — DIPHENHYDRAMINE HCL 25 MG PO CAPS: 25.0000 mg | ORAL_CAPSULE | Freq: Four times a day (QID) | ORAL | Status: DC | PRN

## 2014-01-14 MED ORDER — BUPROPION HCL ER (XL) 150 MG PO TB24
150.0000 mg | ORAL_TABLET | Freq: Every day | ORAL | Status: DC
  Administered 2014-01-15 – 2014-01-16 (×2): 150 mg via ORAL
  Filled 2014-01-14 (×3): qty 1

## 2014-01-14 MED ORDER — SENNOSIDES-DOCUSATE SODIUM 8.6-50 MG PO TABS
2.0000 | ORAL_TABLET | ORAL | Status: DC
  Administered 2014-01-15 (×2): 2 via ORAL
  Filled 2014-01-14: qty 2

## 2014-01-14 MED ORDER — LIDOCAINE HCL (PF) 1 % IJ SOLN
INTRAMUSCULAR | Status: DC | PRN
Start: 2014-01-14 — End: 2014-01-14
  Administered 2014-01-14 (×4): 4 mL

## 2014-01-14 MED ORDER — DIBUCAINE 1 % RE OINT: 1.0000 "application " | TOPICAL_OINTMENT | RECTAL | Status: DC | PRN

## 2014-01-14 MED ORDER — ZOLPIDEM TARTRATE 5 MG PO TABS: 5.0000 mg | ORAL_TABLET | Freq: Every evening | ORAL | Status: DC | PRN

## 2014-01-14 MED ORDER — TETANUS-DIPHTH-ACELL PERTUSSIS 5-2.5-18.5 LF-MCG/0.5 IM SUSP: 0.5000 mL | Freq: Once | INTRAMUSCULAR | Status: DC

## 2014-01-14 MED ORDER — SIMETHICONE 80 MG PO CHEW: 80.0000 mg | CHEWABLE_TABLET | ORAL | Status: DC | PRN

## 2014-01-14 MED ORDER — BUPRENORPHINE HCL 2 MG SL SUBL
2.0000 mg | SUBLINGUAL_TABLET | Freq: Three times a day (TID) | SUBLINGUAL | Status: DC
Start: 2014-01-14 — End: 2014-01-14

## 2014-01-14 MED ORDER — WITCH HAZEL-GLYCERIN EX PADS: 1.0000 "application " | MEDICATED_PAD | CUTANEOUS | Status: DC | PRN

## 2014-01-14 MED ORDER — BUPRENORPHINE HCL 2 MG SL SUBL
2.0000 mg | SUBLINGUAL_TABLET | Freq: Three times a day (TID) | SUBLINGUAL | Status: DC
  Administered 2014-01-14 – 2014-01-16 (×5): 2 mg via SUBLINGUAL
  Filled 2014-01-14 (×5): qty 1

## 2014-01-14 MED ORDER — METHYLERGONOVINE MALEATE 0.2 MG/ML IJ SOLN
0.2000 mg | INTRAMUSCULAR | Status: DC | PRN
Start: 2014-01-14 — End: 2014-01-16

## 2014-01-14 MED ORDER — IBUPROFEN 600 MG PO TABS
600.0000 mg | ORAL_TABLET | Freq: Four times a day (QID) | ORAL | Status: DC
  Administered 2014-01-14 – 2014-01-16 (×8): 600 mg via ORAL
  Filled 2014-01-14 (×6): qty 1

## 2014-01-14 NOTE — Progress Notes (Signed)
Lauren Harvey is a 33 y.o. G1P0000 at [redacted]w[redacted]d by LMP admitted for induction of labor due to postterm and history of opiate addiction.  Subjective: occ pressure  Objective: BP 131/65 mmHg  Pulse 75  Temp(Src) 98.2 F (36.8 C) (Oral)  Resp 18  Ht 5' 8.5" (1.74 m)  Wt 108.863 kg (240 lb)  BMI 35.96 kg/m2  SpO2 98%  LMP 04/06/2013      FHT:  FHR: 145 bpm, variability: moderate,  accelerations:  Present,  decelerations:  Absent UC:   irregular, every 3-5 minutes SVE:   5-6/90/0  ?SROM  Labs: Lab Results  Component Value Date   WBC 12.6* 01/13/2014   HGB 12.2 01/13/2014   HCT 36.1 01/13/2014   MCV 93.0 01/13/2014   PLT 318 01/13/2014    Assessment / Plan: Induction of labor due to postterm,  progressing well on pitocin  Labor: Progressing normally Preeclampsia:  no signs or symptoms of toxicity Fetal Wellbeing:  Category I Pain Control:  Epidural I/D:  n/a Anticipated MOD:  NSVD  Holden Draughon J 01/14/2014, 8:02 AM

## 2014-01-14 NOTE — Plan of Care (Signed)
Problem: Consults Goal: Postpartum Patient Education (See Patient Education module for education specifics.) Outcome: Completed/Met Date Met:  01/14/14  Problem: Phase I Progression Outcomes Goal: Pain controlled with appropriate interventions Outcome: Completed/Met Date Met:  01/14/14 Goal: Voiding adequately Outcome: Completed/Met Date Met:  01/14/14 Goal: OOB as tolerated unless otherwise ordered Outcome: Completed/Met Date Met:  01/14/14 Goal: VS, stable, temp < 100.4 degrees F Outcome: Completed/Met Date Met:  01/14/14 Goal: Initial discharge plan identified Outcome: Completed/Met Date Met:  01/14/14 Goal: Other Phase I Outcomes/Goals Outcome: Completed/Met Date Met:  01/14/14

## 2014-01-14 NOTE — Anesthesia Preprocedure Evaluation (Signed)
Anesthesia Evaluation  Patient identified by MRN, date of birth, ID band Patient awake    Reviewed: Allergy & Precautions, H&P , NPO status , Patient's Chart, lab work & pertinent test results, reviewed documented beta blocker date and time   History of Anesthesia Complications Negative for: history of anesthetic complications  Airway Mallampati: I  TM Distance: >3 FB Neck ROM: full    Dental  (+) Teeth Intact   Pulmonary neg pulmonary ROS, former smoker,  breath sounds clear to auscultation        Cardiovascular negative cardio ROS  Rhythm:regular Rate:Normal     Neuro/Psych  Headaches, Anxiety Depression    GI/Hepatic negative GI ROS, (+)     substance abuse (h/o opiate abuse on subutex)  ,   Endo/Other  BMI 36  Renal/GU negative Renal ROS     Musculoskeletal   Abdominal   Peds  Hematology negative hematology ROS (+)   Anesthesia Other Findings   Reproductive/Obstetrics (+) Pregnancy                             Anesthesia Physical Anesthesia Plan  ASA: II  Anesthesia Plan: Epidural   Post-op Pain Management:    Induction:   Airway Management Planned:   Additional Equipment:   Intra-op Plan:   Post-operative Plan:   Informed Consent: I have reviewed the patients History and Physical, chart, labs and discussed the procedure including the risks, benefits and alternatives for the proposed anesthesia with the patient or authorized representative who has indicated his/her understanding and acceptance.     Plan Discussed with:   Anesthesia Plan Comments:         Anesthesia Quick Evaluation

## 2014-01-14 NOTE — Anesthesia Procedure Notes (Signed)
Epidural Patient location during procedure: OB Start time: 01/14/2014 1:46 AM  Staffing Anesthesiologist: Adit Riddles Performed by: anesthesiologist   Preanesthetic Checklist Completed: patient identified, site marked, surgical consent, pre-op evaluation, timeout performed, IV checked, risks and benefits discussed and monitors and equipment checked  Epidural Patient position: sitting Prep: site prepped and draped and DuraPrep Patient monitoring: continuous pulse ox and blood pressure Approach: midline Location: L3-L4 Injection technique: LOR air  Needle:  Needle type: Tuohy  Needle gauge: 17 G Needle length: 9 cm and 9 Needle insertion depth: 7 cm Catheter type: closed end flexible Catheter size: 19 Gauge Catheter at skin depth: 12 cm Test dose: negative  Assessment Events: blood not aspirated, injection not painful, no injection resistance, negative IV test and no paresthesia  Additional Notes Discussed risk of headache, infection, bleeding, nerve injury and failed or incomplete block.  Patient voices understanding and wishes to proceed.  Epidural placed easily on first attempt.  No paresthesia.  Patient tolerated procedure well with no apparent complications.  Charlton Haws, MDReason for block:procedure for pain

## 2014-01-15 DIAGNOSIS — F1120 Opioid dependence, uncomplicated: Secondary | ICD-10-CM | POA: Diagnosis present

## 2014-01-15 DIAGNOSIS — F1121 Opioid dependence, in remission: Secondary | ICD-10-CM | POA: Diagnosis present

## 2014-01-15 DIAGNOSIS — F53 Postpartum depression: Secondary | ICD-10-CM | POA: Diagnosis present

## 2014-01-15 DIAGNOSIS — O99345 Other mental disorders complicating the puerperium: Secondary | ICD-10-CM | POA: Diagnosis present

## 2014-01-15 LAB — CBC
HEMATOCRIT: 32.9 % — AB (ref 36.0–46.0)
Hemoglobin: 10.9 g/dL — ABNORMAL LOW (ref 12.0–15.0)
MCH: 31.4 pg (ref 26.0–34.0)
MCHC: 33.1 g/dL (ref 30.0–36.0)
MCV: 94.8 fL (ref 78.0–100.0)
Platelets: 285 10*3/uL (ref 150–400)
RBC: 3.47 MIL/uL — ABNORMAL LOW (ref 3.87–5.11)
RDW: 13.8 % (ref 11.5–15.5)
WBC: 17.4 10*3/uL — ABNORMAL HIGH (ref 4.0–10.5)

## 2014-01-15 NOTE — Lactation Note (Addendum)
This note was copied from the chart of [place]. Lactation Consultation Note  P1, Difficulty latching.  #20NS was started last night by RN. Mother has wide spaced breast.  Was able to hand express a drop of colostrum. Left nipple bruised.  Nipples are flat but evert with stimulation. Baby was circumcised this morning and has been sleepy.  Undressed baby.  Reviewed waking techniques. Had mother prepump with manual pump and demonstrated how to use breast shells. Attempted latching baby in fb hold on both breasts.  Baby would not suck and sustain latch. Mother put on #20NS and baby latched with some assistance.  He has a tight posterior frenulum. Some sucks viewed but no swallows. Baby breastfed for 10 min on right and fell asleep. No colostrum view in NS although mother states with earlier feeding colostrum was viewed. After burping, baby latched on left breast with NS.   Sucks viewed for 12 min. Set up DEBP.  Suggest mother post pump 4-6 times day for 15-20 min both breasts. Give baby back volume pumped.  Demonstrated how to spoon feed, prefill nipple shield or syringe finger feed. Reviewed cluster feeding, cleaning and milk storage. Provided mother with comfort gels and reviewed applying ebm for soreness. Without assistance and without NS mother was able to relatch baby on left breast. She let him suck her finger first with some breastmilk drops on her finger then latched him. Improved sucks and some swallows viewed.  Patient Name: Lauren Harvey [id number] Date: 01/15/2014 Reason for consult: Initial assessment   Maternal Data    Feeding Feeding Type: Breast Fed Length of feed: 10 min  LATCH Score/Interventions Latch: Repeated attempts needed to sustain latch, nipple held in mouth throughout feeding, stimulation needed to elicit sucking reflex. Intervention(s): Skin to skin;Waking techniques Intervention(s): Adjust position;Assist with latch;Breast massage  Audible  Swallowing: None Intervention(s): Skin to skin;Hand expression  Type of Nipple: Flat  Comfort (Breast/Nipple): Filling, red/small blisters or bruises, mild/mod discomfort  Problem noted: Mild/Moderate discomfort Interventions (Mild/moderate discomfort): Hand expression;Pre-pump if needed;Comfort gels  Hold (Positioning): Assistance needed to correctly position infant at breast and maintain latch.  LATCH Score: 4  Lactation Tools Discussed/Used Tools: Nipple Shields Nipple shield size: 20 Pump Review: Setup, frequency, and cleaning;Milk Storage Initiated by:: Vivianne Master Date initiated:: 01/16/14   Consult Status Consult Status: Follow-up Date: 01/16/14 Follow-up type: In-patient    Vivianne Master [facility] 01/15/2014, 2:15 PM

## 2014-01-15 NOTE — Progress Notes (Signed)
PPD #1- SVD  Subjective:   Reports feeling well Tolerating po/ No nausea or vomiting Bleeding is light Pain controlled with Motrin Up ad lib / ambulatory / voiding without problems Newborn: breastfeeding  / Circumcision: done   Objective:   VS:  VS:  Filed Vitals:   01/14/14 1730 01/14/14 1840 01/14/14 2208 01/15/14 0604  BP: 119/71 138/74 118/71 115/66  Pulse: 91 96 97 84  Temp: 99.4 F (37.4 C) 98.9 F (37.2 C) 98.3 F (36.8 C) 98.1 F (36.7 C)  TempSrc: Oral Oral Oral Oral  Resp: 17 20    Height:      Weight:      SpO2:   98% 96%    LABS:  Recent Labs  01/13/14 1945 01/15/14 0615  WBC 12.6* 17.4*  HGB 12.2 10.9*  PLT 318 285   Blood type: --/--/A POS, A POS (11/30 1945) Rubella: Immune (04/30 0000)   I&O: Intake/Output      12/01 0701 - 12/02 0700 12/02 0701 - 12/03 0700   Urine (mL/kg/hr) 800 (0.3)    Blood 200 (0.1)    Total Output 1000     Net -1000            Physical Exam: Alert and oriented x3 Abdomen: soft, non-tender, non-distended  Fundus: firm, non-tender, U-2 Perineum: Well approximated, no significant erythema, edema, or drainage; healing well. Rt vular and multiple perianal condyloma present. Lochia: small Extremities: trace BLE edema, no calf pain or tenderness    Assessment:  PPD #1 G1P1001/ S/P:induced vaginal, 2nd degree laceration Mild anemia Chronic Opioid dependance-on Subutex Depression Doing well    Plan: Continue routine post partum orders Anticipate D/C home tomorrow Plan to tx condyloma at 6 wk pp visit Follow-up with [name] (psych) in 1 week -already scheduled   Julianne Handler, N MSN, CNM 01/15/2014, 10:26 AM

## 2014-01-15 NOTE — Progress Notes (Addendum)
Clinical Social Work Department PSYCHOSOCIAL ASSESSMENT - MATERNAL/CHILD 08/06/13  Patient:  Lauren Harvey, Lauren Harvey  Account Number:  [id number]  Admit Date:  01/13/2014  Clinical Social Worker:  Lucita Ferrara, CLINICAL SOCIAL WORKER   Date/Time:  2013-12-13 09:45 AM  Date Referred:  08-04-13   Referral source  Central Nursery     Referred reason  Depression/Anxiety  Substance Abuse   Other referral source:    I:  FAMILY / HOME ENVIRONMENT Child's legal guardian:  PARENT  Guardian - Name Lauren Harvey, [name] [phone]  [name]  same as above   Other household support members/support persons Other support:   [name] reported having strong family support. She shared that her aunt will be staying in their home for a couple of weeks to assist her with transition to the postpartum period.    II  PSYCHOSOCIAL DATA Information Source:  Family Interview  Financial and [employer] Employment:   CSW did not assess employment.   Financial resources:  Multimedia programmer If [place]:    School / Grade:  N/A Music therapist / Child Services Coordination / Early Interventions:   None reported  Cultural issues impacting care:   None reported    III  STRENGTHS Strengths  Adequate Resources  Home prepared for Child (including basic supplies)  Supportive family/friends   Strength comment:  [name] and [name] have been proactive in preparing for postpartum depression given maternal mental health history.   IV  RISK FACTORS AND CURRENT PROBLEMS Current Problem:  YES   Risk Factor & Current Problem Patient Issue Family Issue Risk Factor / Current Problem Comment  Mental Illness Y N [name] presents with history of depression. She is currently prescribed Wellbutrin and Viibryd for symptoms of depression. She stated that she is closely followed by her psychiatirst and that her symptoms have been  stable since the first trimester.  Substance Abuse Y N [name] is currently prescribed Subutex, has been in recovery from Opiate addiction for 3 years.  [name] stated that prior to pregnancy, she was in process of weening herself off of suboxone (was switched to subutex when had positive pregnancy test).    V  SOCIAL WORK ASSESSMENT CSW met with the [name] due to history of depression and current subutex treatment.  [name] provided consent for the [name] to be present.  [name] and [name] were easily engaged, were receptive to the visit, and openly discussed reason for CSW consult.  The [name] displayed a full range in affect, and presented in a pleasant mood.  They presented with insight and awareness related to the [name]'s mental health, and also presented as proactive as they discussed their plans as they transition into the postpartum period given the [name]'s mental health history.  [name] did not present with any acute mental health symptoms.   [name] and [name] expressed excitement as they transition into the postpartum period.  They both stated that it continues to feel "surreal", but they love being a parent so far.  [name] and [name] shared that they have positive support, including the [name]'s aunt staying with them in their home.  The [name] and [name] reflected upon the numerous life transitions in past 18 months including getting married, buying a home, and now having a baby. [name] discussed that she anticipated feeling overwhelmed with these changes, but shared that she is not.  She stated that she once "dreamed" of having this life,  previously thought it was not obtainble, and is now "so happy" that her dream is now her reality.  She continued to express gratitude for the [name] since he is a positive support and a "calming presence".    Without prompting, [name] openly discussed her history of depression.  She stated, "I've had depression forever", and stated that her symptoms worsened during her first trimester of her pregnancy.  [name] and [name]  discussed the [name]'s treatment, including being closely followed by [name] at [facility].  She shared that he has been closely monitoring her medications and symptoms.  Per the [name], her symptoms stabilized once the second trimester started.  She stated that she continues to take Wellbutrin and Viibryd, and discussed belief that the Viibryd is highly effective in symptoms control.  She also continued to discuss the strong therapeutic relationship that she has with her psychiatrist. She and the [name] continued to discuss the numerous conversations they have had with her MD as she prepares for the postpartum period.  They presented with insight and awareness of increased awareness for developing postpartum depression given her mental health history.  They reported low grade stress/concern about developing postpartum depression, since they shared belief "what happens, happens".  CSW reviewed all of the measures they have taken to prepare for the postpartum period ,and they acknowledged that they have been proactive in preparing for the baby's arrival.  [name] reported concern about being "emotional" since the baby's delivery.  CSW normalized her tears, and continued to differentiate between the baby blues and postpartum depression.  [name] reported sense of relief noting that she may be experiencing the baby blues.  [name] and [name] reported few concerns about the [name]'s mental health given the stabilization of symptoms and positive support from her MD.    The [name] presented with awareness of need to care for herself in order to care best for her son. She shared belief that if she needs to take medications to avoid a depressive episode, she will, even if it causes her to be unable to breastfeed.  The [name] confirmed that Dr.Book is also prescribing her Subutex.  She shared that prior to the pregnancy, she was prescribed Suboxone.  She discussed that she has been in recovery from opiate addiction for 3 years, and was in  the process of discontinuing her suboxone until she learned that she was pregnant (suboxone was switched to subutex when she learned that she was pregnant).  [name] denied any questions or concerns about her subutex treatment, and shared that she is worried about the baby's health.  CSW guided the [name] and [name] to identify questions that they can ask the pediatrician in order to ensure that the baby is healthy.  [name] and [name] denied questions or concerns related to the hospital drug screen policy, and verbalized understanding.  [name] denied any substance use during pregnancy.   [name] and [name] denied additional questions or concerns.  They expressed appreciation for the visit, and acknowledged CSW availability while at the hospital.   No barriers to discharge.  VI SOCIAL WORK PLAN Social Work Therapist, art  No Further Intervention Required / No Barriers to Discharge   Type of pt/family education:   Postpartum depression  Hospital drug screen policy   If child protective services report - county:   If child protective services report - date:   Information/referral to community resources comment:   No referrals needed at this time.   Other social work  plan:   CSW to follow-up PRN.  CSW to monitor MDS and will make CPS report if needed.

## 2014-01-15 NOTE — Anesthesia Postprocedure Evaluation (Signed)
  Anesthesia Post-op Note  Patient: Crysten Kaman [facility]  Procedure(s) Performed: * No procedures listed *  Patient Location: Mother/Baby  Anesthesia Type:Epidural  Level of Consciousness: awake, alert , oriented and patient cooperative  Airway and Oxygen Therapy: Patient Spontanous Breathing  Post-op Pain: none  Post-op Assessment: Post-op Vital signs reviewed, Patient's Cardiovascular Status Stable, Respiratory Function Stable, Patent Airway, No signs of Nausea or vomiting, Adequate PO intake, Pain level controlled, No headache, No backache, No residual numbness and No residual motor weakness  Post-op Vital Signs: Reviewed and stable  Last Vitals:  Filed Vitals:   01/15/14 0604  BP: 115/66  Pulse: 84  Temp: 36.7 C  Resp:     Complications: No apparent anesthesia complications

## 2014-01-15 NOTE — Plan of Care (Signed)
Problem: Phase II Progression Outcomes Goal: Pain controlled on oral analgesia Outcome: Completed/Met Date Met:  01/15/14 Goal: Progress activity as tolerated unless otherwise ordered Outcome: Completed/Met Date Met:  01/15/14 Goal: Afebrile, VS remain stable Outcome: Completed/Met Date Met:  01/15/14 Goal: Tolerating diet Outcome: Completed/Met Date Met:  01/15/14 Goal: Other Phase II Outcomes/Goals Outcome: Not Applicable Date Met:  73/54/30

## 2014-01-16 MED ORDER — IBUPROFEN 600 MG PO TABS: 600.0000 mg | ORAL_TABLET | Freq: Four times a day (QID) | ORAL | Status: AC

## 2014-01-16 NOTE — Progress Notes (Signed)
PPD #2, SVD, baby boy  S:  Reports feeling good, ready to go home             Tolerating po/ No nausea or vomiting             Bleeding is light             Pain controlled withMotrin             Up ad lib / ambulatory / voiding QS  Newborn breast feeding - going well has worked with lactation / Circumcision - done  O:               VS: BP 116/62 mmHg  Pulse 72  Temp(Src) 98.1 F (36.7 C) (Oral)  Resp 18  Ht 5' 8.5" (1.74 m)  Wt 108.863 kg (240 lb)  BMI 35.96 kg/m2  SpO2 98%  LMP 04/06/2013  Breastfeeding? Unknown   LABS:              Recent Labs  01/13/14 1945 01/15/14 0615  WBC 12.6* 17.4*  HGB 12.2 10.9*  PLT 318 285               Blood type: --/--/A POS, A POS (11/30 1945)  Rubella: Immune (04/30 0000)                      Physical Exam:             Alert and oriented X3  Lungs: Clear and unlabored  Heart: regular rate and rhythm / no mumurs  Abdomen: soft, non-tender, non-distended,active bowel sounds present in all 4 quadrants             Fundus: firm, non-tender, U-1  Perineum: well-approximated 2nd degree laceration healing well, no ecchymosis or edema noted  Lochia: small, red bleeding, no clots noted  Extremities: +1 dependent edema, no calf pain or tenderness bilaterally    A: PPD # 2, SVD, baby boy             Depression - on Wellbutrin, stable             Chronic Opioid Dependence - on Subutex  Doing well - stable status  P: Discharge home today             WOB discharge book given and reviewed   Follow-up with [name] (psych) in 1 week - already scheduled             Plan to tx condyloma at 6 wk pp visit             Avoid bladder distention - void every 2-3 hours   Kassie Mends, SNM

## 2014-01-16 NOTE — Discharge Summary (Signed)
Obstetric Discharge Summary  Reason for Admission: induction of labor Prenatal Procedures: NST and ultrasound Intrapartum Procedures: spontaneous vaginal delivery Postpartum Procedures: none Complications-Operative and Postpartum: 2nd degree perineal laceration HEMOGLOBIN  Date Value Ref Range Status  01/15/2014 10.9* 12.0 - 15.0 g/dL Final   HCT  Date Value Ref Range Status  01/15/2014 32.9* 36.0 - 46.0 % Final    Physical Exam:  General: alert, cooperative and no distress Lochia: appropriate Uterine Fundus: firm Incision: healing well DVT Evaluation: No evidence of DVT seen on physical exam.  Discharge Diagnoses: Term Pregnancy-delivered  Discharge Information: Date: 01/16/2014 Activity: pelvic rest Diet: routine Medications: PNV, Ibuprofen and Subutex Condition: stable Instructions: refer to practice specific booklet Discharge to: home   Newborn Data: Live born female  Birth Weight: 7 lb 6.5 oz (3360 g) APGAR: 8, 9  Home with mother.  Lauren Harvey 01/16/2014, 11:13 AM

## 2014-01-16 NOTE — Lactation Note (Addendum)
This note was copied from the chart of [place]. Lactation Consultation Note Gave mom copies of information on medication of Wellbutrin L3 may cause low milk supply, and Viibryd L3 no data. Mom BF holding baby in blankets sitting on side of bed. Mom stated baby falls a sleep after feeding for 5 min. Encouraged to unwrap and stimulate baby. Mom states she has done that but when she goes to wrap him after finished feeding he cries and wants more. Encouraged to be comfortable during BF and massage breast occasionally during feeding to express milk into baby's mouth and stimulate as feeding. Baby has 7 % wt. Loss. Discussed cluster feeding. Baby had 6 voids and 6 stools. Adequate for hours of age. Patient Name: Lauren Harvey [id number] Date: 01/16/2014 Reason for consult: Follow-up assessment;Infant weight loss   Maternal Data    Feeding Feeding Type: Breast Fed Length of feed: 15 min  LATCH Score/Interventions Latch: Grasps breast easily, tongue down, lips flanged, rhythmical sucking. Intervention(s): Skin to skin;Teach feeding cues;Waking techniques Intervention(s): Breast compression;Breast massage  Audible Swallowing: A few with stimulation Intervention(s): Hand expression Intervention(s): Hand expression;Alternate breast massage  Type of Nipple: Flat Intervention(s): Shells;Double electric pump  Comfort (Breast/Nipple): Filling, red/small blisters or bruises, mild/mod discomfort  Problem noted: Mild/Moderate discomfort Interventions (Mild/moderate discomfort): Breast shields;Pre-pump if needed;Hand expression;Hand massage;Comfort gels  Hold (Positioning): No assistance needed to correctly position infant at breast. Intervention(s): Support Pillows;Position options;Skin to skin  LATCH Score: 7  Lactation Tools Discussed/Used     Consult Status Consult Status: Follow-up Date: 01/17/14 Follow-up type: In-patient    [name] 01/16/2014, 3:34 AM

## 2014-01-16 NOTE — Plan of Care (Signed)
Problem: Discharge Progression Outcomes Goal: Barriers To Progression Addressed/Resolved Outcome: Completed/Met Date Met:  01/16/14 Goal: Activity appropriate for discharge plan Outcome: Completed/Met Date Met:  01/16/14 Goal: Tolerating diet Outcome: Completed/Met Date Met:  01/59/96 Goal: Complications resolved/controlled Outcome: Completed/Met Date Met:  01/16/14 Goal: Pain controlled with appropriate interventions Outcome: Completed/Met Date Met:  01/16/14 Goal: Afebrile, VS remain stable at discharge Outcome: Completed/Met Date Met:  01/16/14 Goal: Discharge plan in place and appropriate Outcome: Completed/Met Date Met:  01/16/14 Goal: Other Discharge Outcomes/Goals Outcome: Not Applicable Date Met:  89/57/02

## 2016-04-07 DIAGNOSIS — R51 Headache: Secondary | ICD-10-CM | POA: Diagnosis not present

## 2016-04-07 DIAGNOSIS — Z049 Encounter for examination and observation for unspecified reason: Secondary | ICD-10-CM | POA: Diagnosis not present

## 2016-04-07 DIAGNOSIS — Z79899 Other long term (current) drug therapy: Secondary | ICD-10-CM | POA: Diagnosis not present

## 2016-04-07 DIAGNOSIS — G43719 Chronic migraine without aura, intractable, without status migrainosus: Secondary | ICD-10-CM | POA: Diagnosis not present

## 2016-04-07 DIAGNOSIS — G43839 Menstrual migraine, intractable, without status migrainosus: Secondary | ICD-10-CM | POA: Diagnosis not present

## 2016-05-10 DIAGNOSIS — G43719 Chronic migraine without aura, intractable, without status migrainosus: Secondary | ICD-10-CM | POA: Diagnosis not present

## 2016-05-10 DIAGNOSIS — G43839 Menstrual migraine, intractable, without status migrainosus: Secondary | ICD-10-CM | POA: Diagnosis not present

## 2016-05-20 DIAGNOSIS — H6641 Suppurative otitis media, unspecified, right ear: Secondary | ICD-10-CM | POA: Diagnosis not present

## 2016-06-21 DIAGNOSIS — G43839 Menstrual migraine, intractable, without status migrainosus: Secondary | ICD-10-CM | POA: Diagnosis not present

## 2016-06-21 DIAGNOSIS — G43719 Chronic migraine without aura, intractable, without status migrainosus: Secondary | ICD-10-CM | POA: Diagnosis not present

## 2016-07-13 DIAGNOSIS — G43909 Migraine, unspecified, not intractable, without status migrainosus: Secondary | ICD-10-CM | POA: Diagnosis not present

## 2016-09-21 DIAGNOSIS — G43719 Chronic migraine without aura, intractable, without status migrainosus: Secondary | ICD-10-CM | POA: Diagnosis not present

## 2016-09-21 DIAGNOSIS — G43839 Menstrual migraine, intractable, without status migrainosus: Secondary | ICD-10-CM | POA: Diagnosis not present

## 2017-01-28 DIAGNOSIS — M67911 Unspecified disorder of synovium and tendon, right shoulder: Secondary | ICD-10-CM | POA: Diagnosis not present

## 2017-01-28 DIAGNOSIS — M24811 Other specific joint derangements of right shoulder, not elsewhere classified: Secondary | ICD-10-CM | POA: Diagnosis not present

## 2017-02-13 DIAGNOSIS — G43839 Menstrual migraine, intractable, without status migrainosus: Secondary | ICD-10-CM | POA: Diagnosis not present

## 2017-02-13 DIAGNOSIS — G43719 Chronic migraine without aura, intractable, without status migrainosus: Secondary | ICD-10-CM | POA: Diagnosis not present

## 2017-05-01 DIAGNOSIS — J019 Acute sinusitis, unspecified: Secondary | ICD-10-CM | POA: Diagnosis not present

## 2017-05-01 DIAGNOSIS — B9689 Other specified bacterial agents as the cause of diseases classified elsewhere: Secondary | ICD-10-CM | POA: Diagnosis not present

## 2017-05-01 DIAGNOSIS — J22 Unspecified acute lower respiratory infection: Secondary | ICD-10-CM | POA: Diagnosis not present

## 2017-06-27 DIAGNOSIS — G43839 Menstrual migraine, intractable, without status migrainosus: Secondary | ICD-10-CM | POA: Diagnosis not present

## 2017-06-27 DIAGNOSIS — G43719 Chronic migraine without aura, intractable, without status migrainosus: Secondary | ICD-10-CM | POA: Diagnosis not present

## 2017-07-13 DIAGNOSIS — M5431 Sciatica, right side: Secondary | ICD-10-CM | POA: Diagnosis not present

## 2017-08-08 DIAGNOSIS — G43719 Chronic migraine without aura, intractable, without status migrainosus: Secondary | ICD-10-CM | POA: Diagnosis not present

## 2017-08-21 DIAGNOSIS — R319 Hematuria, unspecified: Secondary | ICD-10-CM | POA: Diagnosis not present

## 2017-09-05 DIAGNOSIS — M545 Low back pain: Secondary | ICD-10-CM | POA: Diagnosis not present

## 2017-12-10 DIAGNOSIS — Z23 Encounter for immunization: Secondary | ICD-10-CM | POA: Diagnosis not present

## 2018-02-01 DIAGNOSIS — G43719 Chronic migraine without aura, intractable, without status migrainosus: Secondary | ICD-10-CM | POA: Diagnosis not present

## 2018-02-22 DIAGNOSIS — J069 Acute upper respiratory infection, unspecified: Secondary | ICD-10-CM | POA: Diagnosis not present

## 2019-03-12 ENCOUNTER — Other Ambulatory Visit: Payer: Self-pay

## 2019-03-14 ENCOUNTER — Ambulatory Visit: Payer: 59 | Admitting: Obstetrics & Gynecology

## 2019-03-14 ENCOUNTER — Other Ambulatory Visit (HOSPITAL_COMMUNITY)
Admission: RE | Admit: 2019-03-14 | Discharge: 2019-03-14 | Disposition: A | Discharge status: Home / Self Care | Payer: 59 | Source: Ambulatory Visit | Attending: Obstetrics & Gynecology | Admitting: Obstetrics & Gynecology

## 2019-03-14 ENCOUNTER — Encounter: Payer: Self-pay | Admitting: Obstetrics & Gynecology

## 2019-03-14 ENCOUNTER — Other Ambulatory Visit: Payer: Self-pay

## 2019-03-14 VITALS — BP 116/76 | HR 88 | Temp 97.3°F | Resp 12 | Ht 68.5 in | Wt 258.0 lb

## 2019-03-14 DIAGNOSIS — Z124 Encounter for screening for malignant neoplasm of cervix: Secondary | ICD-10-CM

## 2019-03-14 DIAGNOSIS — Z0000 Encounter for general adult medical examination without abnormal findings: Secondary | ICD-10-CM | POA: Diagnosis not present

## 2019-03-14 DIAGNOSIS — Z01419 Encounter for gynecological examination (general) (routine) without abnormal findings: Secondary | ICD-10-CM

## 2019-03-14 MED ORDER — NORETHIN ACE-ETH ESTRAD-FE 1-20 MG-MCG PO TABS: 1.0000 | ORAL_TABLET | Freq: Every day | ORAL | 3 refills | Status: DC

## 2019-03-14 NOTE — Progress Notes (Signed)
39 y.o. G8P1001 Married White or Caucasian female here as an old-new patient for an annual exam. Patient has seen Dr. Sabra Heck in the past prior to [facility] care with [employer]. Patient would like to discuss restarting birth control.  Hadn't really had care in the last five years.  Son is in daycare.  His name is Liane Comber.  Does not want any more children.    Cycles are regular.  Flow can be heavy.  Flow lasts about 4 to 5 days.  Would like to restart OCP.  H/o migraines without aura.  Is followed at [facility].    Patient's last menstrual period was 03/04/2019.          Sexually active: Yes.    The current method of family planning is none.    Exercising: No.  The patient does not participate in regular exercise at present. Smoker:  No (former smoker)  Health Maintenance: Pap:  2015 Normal   07/11/12 Neg:Neg HR HPV History of abnormal Pap:  Yes, per patient had Colposcopy years ago TDaP:  2015 Pneumonia vaccine(s):  n/a Shingrix:   n/a Hep C testing: n/a Screening Labs: discuss today   reports that she quit smoking about 5 years ago. She has never used smokeless tobacco. She reports that she does not drink alcohol or use drugs.  Past Medical History:  Diagnosis Date  . Abnormal Pap smear    ASCUS  . Abnormal TSH   . Anxiety and depression   . History of kidney stones   . Migraines   . Opiate addiction ([place])    history occasional suboxone  . Postpartum care following vaginal delivery (12/1) 01/14/2014  . Vaginal Pap smear, abnormal     Past Surgical History:  Procedure Laterality Date  . CHOLECYSTECTOMY  2008  . COLPOSCOPY     negative    Current Outpatient Medications  Medication Sig Dispense Refill  . ibuprofen (ADVIL,MOTRIN) 600 MG tablet Take 1 tablet (600 mg total) by mouth every 6 (six) hours. 30 tablet 0  . REXULTI 2 MG TABS tablet Take 2 mg by mouth daily.    . SUMAtriptan (IMITREX) 100 MG tablet Take 100 mg by mouth every 2 (two) hours as needed. For migraines     . VIIBRYD 40 MG TABS Take 40 mg by mouth daily.    Marland Kitchen zonisamide (ZONEGRAN) 100 MG capsule Take 200 mg by mouth daily.     No current facility-administered medications for this visit.    Family History  Problem Relation Age of Onset  . ALS Mother   . Migraines Mother   . Hypertension Maternal Aunt   . Urolithiasis Maternal Aunt   . Heart disease Maternal Grandfather   . Hypertension Father   . Migraines Maternal Grandmother     Review of Systems  All other systems reviewed and are negative.   Exam:   BP 116/76 (BP Location: Right Arm, Patient Position: Sitting, Cuff Size: Large)   Pulse 88   Temp (!) 97.3 F (36.3 C) (Temporal)   Resp 12   Ht 5' 8.5" (1.74 m)   Wt 258 lb (117 kg)   LMP 03/04/2019   BMI 38.66 kg/m      Height: 5' 8.5" (174 cm)  Ht Readings from Last 3 Encounters:  03/14/19 5' 8.5" (1.74 m)  01/13/14 5' 8.5" (1.74 m)  05/23/13 5' 8.5" (1.74 m)    General appearance: alert, cooperative and appears stated age Head: Normocephalic, without obvious abnormality, atraumatic Neck:  no adenopathy, supple, symmetrical, trachea midline and thyroid normal to inspection and palpation Lungs: clear to auscultation bilaterally Breasts: normal appearance, no masses or tenderness Heart: regular rate and rhythm Abdomen: soft, non-tender; bowel sounds normal; no masses,  no organomegaly Extremities: extremities normal, atraumatic, no cyanosis or edema Skin: Skin color, texture, turgor normal. No rashes or lesions Lymph nodes: Cervical, supraclavicular, and axillary nodes normal. No abnormal inguinal nodes palpated Neurologic: Grossly normal   Pelvic: External genitalia:  no lesions              Urethra:  normal appearing urethra with no masses, tenderness or lesions              Bartholins and Skenes: normal                 Vagina: normal appearing vagina with normal color and discharge, no lesions              Cervix: no lesions              Pap taken: Yes.    Bimanual Exam:  Uterus:  normal size, contour, position, consistency, mobility, non-tender              Adnexa: normal adnexa and no mass, fullness, tenderness               Rectovaginal: Confirms               Anus:  normal sphincter tone, no lesions  Chaperone, Terence Lux, CMA, was present for exam.  A:  Well Woman with normal exam Desires contraception H/o migraines without aura Obesity  P:   Mammogram guidelines reviewed.  Not indicated yet. pap smear with HR HPV obtained today CMP and lipids obtained today Release for other labs signed for Dr. Andrew Au office today. Start loestin 1/20 daily.  #61mo supply/3RF.  Recheck 3 months.  Side effects and risks reviewed Recheck 3 monts return annually or prn

## 2019-03-14 NOTE — Addendum Note (Signed)
Addended by: Megan Salon on: 03/14/2019 11:57 AM   Modules accepted: Orders

## 2019-03-15 LAB — LIPID PANEL
Chol/HDL Ratio: 3.5 ratio (ref 0.0–4.4)
Cholesterol, Total: 181 mg/dL (ref 100–199)
HDL: 51 mg/dL (ref >39)
LDL Chol Calc (NIH): 111 mg/dL — ABNORMAL HIGH (ref 0–99)
Triglycerides: 106 mg/dL (ref 0–149)
VLDL Cholesterol Cal: 19 mg/dL (ref 5–40)

## 2019-03-15 LAB — COMPREHENSIVE METABOLIC PANEL
ALT: 15 IU/L (ref 0–32)
AST: 15 IU/L (ref 0–40)
Albumin/Globulin Ratio: 1.7 (ref 1.2–2.2)
Albumin: 4.2 g/dL (ref 3.8–4.8)
Alkaline Phosphatase: 67 IU/L (ref 39–117)
BUN/Creatinine Ratio: 11 (ref 9–23)
BUN: 10 mg/dL (ref 6–20)
Bilirubin Total: 0.2 mg/dL (ref 0.0–1.2)
CO2: 18 mmol/L — ABNORMAL LOW (ref 20–29)
Calcium: 9.3 mg/dL (ref 8.7–10.2)
Chloride: 111 mmol/L — ABNORMAL HIGH (ref 96–106)
Creatinine, Ser: 0.93 mg/dL (ref 0.57–1.00)
GFR calc Af Amer: 90 mL/min/{1.73_m2} (ref >59)
GFR calc non Af Amer: 78 mL/min/{1.73_m2} (ref >59)
Globulin, Total: 2.5 g/dL (ref 1.5–4.5)
Glucose: 108 mg/dL — ABNORMAL HIGH (ref 65–99)
Potassium: 4.2 mmol/L (ref 3.5–5.2)
Sodium: 142 mmol/L (ref 134–144)
Total Protein: 6.7 g/dL (ref 6.0–8.5)

## 2019-03-18 LAB — CYTOLOGY - PAP
Comment: NEGATIVE
Diagnosis: NEGATIVE
High risk HPV: NEGATIVE

## 2019-04-05 ENCOUNTER — Ambulatory Visit: Payer: 59 | Attending: Internal Medicine

## 2019-04-05 ENCOUNTER — Ambulatory Visit: Payer: 59

## 2019-04-05 DIAGNOSIS — Z23 Encounter for immunization: Secondary | ICD-10-CM

## 2019-04-05 NOTE — Progress Notes (Signed)
   Covid-19 Vaccination Clinic  Name:  Lauren Harvey    MRN: [id number] DOB: November 18, 1980  04/05/2019  [name] was observed post Covid-19 immunization for 15 minutes without incidence. She was provided with Vaccine Information Sheet and instruction to access the V-Safe system.   [name] was instructed to call 911 with any severe reactions post vaccine: Marland Kitchen Difficulty breathing  . Swelling of your face and throat  . A fast heartbeat  . A bad rash all over your body  . Dizziness and weakness    Immunizations Administered    Name Date Dose VIS Date Route   Pfizer COVID-19 Vaccine 04/05/2019  4:18 PM 0.3 mL 01/25/2019 Intramuscular   Manufacturer: [place]   Lot: [id number]   Epes: [id number]

## 2019-04-30 ENCOUNTER — Ambulatory Visit: Payer: 59 | Attending: Internal Medicine

## 2019-04-30 DIAGNOSIS — Z23 Encounter for immunization: Secondary | ICD-10-CM

## 2019-04-30 NOTE — Progress Notes (Signed)
   Covid-19 Vaccination Clinic  Name:  Lauren Harvey    MRN: [id number] DOB: 1980/10/10  04/30/2019  Lauren Harvey was observed post Covid-19 immunization for 15 minutes without incident. She was provided with Vaccine Information Sheet and instruction to access the V-Safe system.   Lauren Harvey was instructed to call 911 with any severe reactions post vaccine: Marland Kitchen Difficulty breathing  . Swelling of face and throat  . A fast heartbeat  . A bad rash all over body  . Dizziness and weakness   Immunizations Administered    Name Date Dose VIS Date Route   Pfizer COVID-19 Vaccine 04/30/2019  3:33 PM 0.3 mL 01/25/2019 Intramuscular   Manufacturer: [place]   Lot: [id number]   [place]: [id number]

## 2019-06-10 NOTE — Progress Notes (Signed)
GYNECOLOGY  VISIT  CC:   Recheck OCP  HPI: 39 y.o. G2P1001 Married White or Caucasian female here for 81mth ocp recheck.  Reports cycles are much improved with Loestrin.  However, admits she started smoking again two weeks ago.  She is noticed increased headaches in the last 2 weeks since she started smoking.  Due to age (>35), she cannot be on an estrogen containing method.  Pt advised she must stop now.  Options for progesterone only treatments discussed including POPs, IUD, Nexplanon.  I do not think Depo Provera is a good option with risks of weight gain.  Pt is interested in an IUD, possibly.  Requests additional information.  D/w pt types of IUDs available with pros/cons of each one.  Mirena IUD would be the best option for this patient.  Placement and risks with placement were discussed.  She does want to consider this but would like to start medication today.  Micronor will be prescribed.  Patient understands this is a continuous active pill and that there are no placebos.  She also understand she may have some irregular bleeding with this.  Advised she needs to give me an update about 3 months or she has unacceptable irregular bleeding before that time.   GYNECOLOGIC HISTORY: Patient's last menstrual period was 04/29/2019 (exact date). Contraception: Loestrin fe 1/20 Menopausal hormone therapy: none  Patient Active Problem List   Diagnosis Date Noted  . Opioid dependence ([place]) 01/15/2014  . Depression affecting pregnancy, postpartum 01/15/2014  . Postpartum care following vaginal delivery (12/1) 01/14/2014  . Status post induction of labor 01/13/2014  . Fibroid 05/24/2013    Past Medical History:  Diagnosis Date  . Abnormal Pap smear    ASCUS  . Abnormal TSH   . Anxiety and depression   . History of kidney stones   . Migraine without aura   . Migraines   . Opiate addiction ([place])    history occasional suboxone  . Postpartum care following vaginal delivery (12/1) 01/14/2014  .  Vaginal Pap smear, abnormal     Past Surgical History:  Procedure Laterality Date  . CHOLECYSTECTOMY  2008  . COLPOSCOPY     negative    MEDS:   Current Outpatient Medications on File Prior to Visit  Medication Sig Dispense Refill  . diclofenac (CATAFLAM) 50 MG tablet Take 50 mg by mouth 2 (two) times daily.    Marland Kitchen ibuprofen (ADVIL,MOTRIN) 600 MG tablet Take 1 tablet (600 mg total) by mouth every 6 (six) hours. 30 tablet 0  . norethindrone-ethinyl estradiol (LOESTRIN FE) 1-20 MG-MCG tablet Take 1 tablet by mouth daily. 1 Package 3  . REXULTI 2 MG TABS tablet Take 2 mg by mouth daily.    . SUMAtriptan (IMITREX) 100 MG tablet Take 100 mg by mouth every 2 (two) hours as needed. For migraines    . VIIBRYD 40 MG TABS Take 40 mg by mouth daily.    Marland Kitchen zonisamide (ZONEGRAN) 100 MG capsule Take 200 mg by mouth daily.     No current facility-administered medications on file prior to visit.    ALLERGIES: Dilaudid [hydromorphone hcl]  Family History  Problem Relation Age of Onset  . ALS Mother   . Migraines Mother   . Hypertension Maternal Aunt   . Urolithiasis Maternal Aunt   . Heart disease Maternal Grandfather   . Hypertension Father   . Migraines Maternal Grandmother     SH:  Married, newly smoking again  Review of Systems  Constitutional:  Negative.   HENT:       More migraines  Eyes: Negative.   Respiratory: Negative.   Cardiovascular: Negative.   Gastrointestinal: Negative.   Endocrine: Negative.   Genitourinary: Negative.   Musculoskeletal: Negative.   Skin: Negative.   Allergic/Immunologic: Negative.   Neurological: Negative.   Psychiatric/Behavioral: Negative.     PHYSICAL EXAMINATION:    BP 118/70   Pulse 70   Temp (!) 97.3 F (36.3 C) (Skin)   Resp 16   Wt 263 lb (119.3 kg)   LMP 04/29/2019 (Exact Date) Comment: continuous ocp  BMI 39.41 kg/m     General appearance: alert, cooperative and appears stated age No other physical exam performed  today  Assessment: Recheck after starting combination OCPs Newly smoking Menorrhagia that was improved with combination OCPs Increased headaches last two weeks.  Plan: Advised to stop combination OCPs immediatly due to clotting risk.  Will start micronor now.   3 month supply/3RF to pharmacy.  Pt considering IUD and if desires, is advised to call for placement.  Otherwise, advised to give update about bleeding in about 3 months.  20 minutes total spent with pt in face to face discussion.

## 2019-06-12 ENCOUNTER — Other Ambulatory Visit: Payer: Self-pay

## 2019-06-13 ENCOUNTER — Ambulatory Visit (INDEPENDENT_AMBULATORY_CARE_PROVIDER_SITE_OTHER): Payer: 59 | Admitting: Obstetrics & Gynecology

## 2019-06-13 ENCOUNTER — Encounter: Payer: Self-pay | Admitting: Obstetrics & Gynecology

## 2019-06-13 ENCOUNTER — Other Ambulatory Visit: Payer: Self-pay

## 2019-06-13 VITALS — BP 118/70 | HR 70 | Temp 97.3°F | Resp 16 | Wt 263.0 lb

## 2019-06-13 DIAGNOSIS — N920 Excessive and frequent menstruation with regular cycle: Secondary | ICD-10-CM

## 2019-06-13 DIAGNOSIS — F17200 Nicotine dependence, unspecified, uncomplicated: Secondary | ICD-10-CM | POA: Diagnosis not present

## 2019-06-13 DIAGNOSIS — G43009 Migraine without aura, not intractable, without status migrainosus: Secondary | ICD-10-CM

## 2019-06-13 DIAGNOSIS — T384X5A Adverse effect of oral contraceptives, initial encounter: Secondary | ICD-10-CM | POA: Diagnosis not present

## 2019-06-13 MED ORDER — NORETHINDRONE 0.35 MG PO TABS: 1.0000 | ORAL_TABLET | Freq: Every day | ORAL | 3 refills | Status: DC

## 2019-06-14 ENCOUNTER — Other Ambulatory Visit: Payer: Self-pay | Admitting: Obstetrics & Gynecology

## 2019-06-14 ENCOUNTER — Encounter: Payer: Self-pay | Admitting: Obstetrics & Gynecology

## 2019-06-14 DIAGNOSIS — G43009 Migraine without aura, not intractable, without status migrainosus: Secondary | ICD-10-CM | POA: Insufficient documentation

## 2019-06-14 DIAGNOSIS — F17200 Nicotine dependence, unspecified, uncomplicated: Secondary | ICD-10-CM | POA: Insufficient documentation

## 2020-04-19 ENCOUNTER — Other Ambulatory Visit: Payer: Self-pay | Admitting: Obstetrics & Gynecology

## 2020-04-21 ENCOUNTER — Telehealth (HOSPITAL_BASED_OUTPATIENT_CLINIC_OR_DEPARTMENT_OTHER): Payer: Self-pay | Admitting: Obstetrics & Gynecology

## 2020-04-21 NOTE — Telephone Encounter (Signed)
Called and left message to please call office to schedule her appointment with Dr.Miller.

## 2020-06-05 ENCOUNTER — Ambulatory Visit: Payer: 59

## 2020-06-11 ENCOUNTER — Other Ambulatory Visit: Payer: Self-pay

## 2020-06-11 ENCOUNTER — Ambulatory Visit (INDEPENDENT_AMBULATORY_CARE_PROVIDER_SITE_OTHER): Payer: 59 | Admitting: Obstetrics & Gynecology

## 2020-06-11 ENCOUNTER — Encounter (HOSPITAL_BASED_OUTPATIENT_CLINIC_OR_DEPARTMENT_OTHER): Payer: Self-pay | Admitting: Obstetrics & Gynecology

## 2020-06-11 VITALS — BP 94/59 | HR 75 | Ht 69.0 in | Wt 200.4 lb

## 2020-06-11 DIAGNOSIS — Z3009 Encounter for other general counseling and advice on contraception: Secondary | ICD-10-CM | POA: Diagnosis not present

## 2020-06-11 DIAGNOSIS — F17200 Nicotine dependence, unspecified, uncomplicated: Secondary | ICD-10-CM | POA: Diagnosis not present

## 2020-06-11 DIAGNOSIS — Z01419 Encounter for gynecological examination (general) (routine) without abnormal findings: Secondary | ICD-10-CM

## 2020-06-11 DIAGNOSIS — R634 Abnormal weight loss: Secondary | ICD-10-CM

## 2020-06-11 DIAGNOSIS — Z1231 Encounter for screening mammogram for malignant neoplasm of breast: Secondary | ICD-10-CM

## 2020-06-11 DIAGNOSIS — G43009 Migraine without aura, not intractable, without status migrainosus: Secondary | ICD-10-CM

## 2020-06-11 NOTE — Progress Notes (Signed)
40 y.o. G52P1001 Married White or Caucasian female here for annual exam.  Cycles are still heavy and she is considering other options.  She is interested in thinking about an IUD.  Has lost almost 60 pounds.  Doing [employer].  Still smoking  Patient's last menstrual period was 05/21/2020 (approximate).          Sexually active: Yes.    The current method of family planning is oral progesterone-only contraceptive.    Exercising: No.  Smoker:  yes  Health Maintenance: Pap:  Neg with neg HR HPV 2021 History of abnormal Pap:  Yes, this was in her 20's MMG:  Discussed with pt  Colonoscopy:  Guidelines reviewe TDaP:  2015 Hep C testing: likely was done with pregnancy Screening Labs: 2021   reports that she has been smoking. She has never used smokeless tobacco. She reports that she does not drink alcohol and does not use drugs.  Past Medical History:  Diagnosis Date  . Abnormal Pap smear    ASCUS  . Abnormal TSH   . Anxiety and depression   . History of kidney stones   . Migraine without aura   . Migraines   . Opiate addiction ([place])    history occasional suboxone  . Postpartum care following vaginal delivery (12/1) 01/14/2014  . Vaginal Pap smear, abnormal     Past Surgical History:  Procedure Laterality Date  . CHOLECYSTECTOMY  2008  . COLPOSCOPY     negative    Current Outpatient Medications  Medication Sig Dispense Refill  . ibuprofen (ADVIL,MOTRIN) 600 MG tablet Take 1 tablet (600 mg total) by mouth every 6 (six) hours. 30 tablet 0  . norethindrone (MICRONOR) 0.35 MG tablet TAKE 1 TABLET BY MOUTH EVERY DAY 28 tablet 1  . VIIBRYD 40 MG TABS Take 40 mg by mouth daily.    . diclofenac (CATAFLAM) 50 MG tablet Take 50 mg by mouth 2 (two) times daily. (Patient not taking: Reported on 06/11/2020)    . REXULTI 2 MG TABS tablet Take 2 mg by mouth daily. (Patient not taking: Reported on 06/11/2020)    . SUMAtriptan (IMITREX) 100 MG tablet Take 100 mg by mouth every 2 (two) hours as  needed. For migraines (Patient not taking: Reported on 06/11/2020)    . zonisamide (ZONEGRAN) 100 MG capsule Take 200 mg by mouth daily. (Patient not taking: Reported on 06/11/2020)     No current facility-administered medications for this visit.    Family History  Problem Relation Age of Onset  . ALS Mother   . Migraines Mother   . Hypertension Maternal Aunt   . Urolithiasis Maternal Aunt   . Heart disease Maternal Grandfather   . Hypertension Father   . Migraines Maternal Grandmother     Review of Systems  All other systems reviewed and are negative.   Exam:   BP (!) 94/59 (BP Location: Right Arm, Patient Position: Sitting, Cuff Size: Large)   Pulse 75   Ht 5\' 9"  (1.753 m) Comment: reported  Wt 200 lb 6.4 oz (90.9 kg)   LMP 05/21/2020 (Approximate)   BMI 29.59 kg/m   Height: 5\' 9"  (175.3 cm) (reported)  General appearance: alert, cooperative and appears stated age Head: Normocephalic, without obvious abnormality, atraumatic Neck: no adenopathy, supple, symmetrical, trachea midline and thyroid normal to inspection and palpation Lungs: clear to auscultation bilaterally Breasts: normal appearance, no masses or tenderness Heart: regular rate and rhythm Abdomen: soft, non-tender; bowel sounds normal; no masses,  no organomegaly  Extremities: extremities normal, atraumatic, no cyanosis or edema Skin: Skin color, texture, turgor normal. No rashes or lesions Lymph nodes: Cervical, supraclavicular, and axillary nodes normal. No abnormal inguinal nodes palpated Neurologic: Grossly normal   Pelvic: External genitalia:  no lesions              Urethra:  normal appearing urethra with no masses, tenderness or lesions              Bartholins and Skenes: normal                 Vagina: normal appearing vagina with normal color and no discharge, no lesions              Cervix: no lesions              Pap taken: No. Bimanual Exam:  Uterus:  normal size, contour, position, consistency,  mobility, non-tender              Adnexa: normal adnexa and no mass, fullness, tenderness               Rectovaginal: Confirms               Anus:  normal sphincter tone, no lesions  Chaperone, Octaviano Batty, CMA, was present for exam.  Assessment/Plan: 1. Well woman exam with routine gynecological exam - Pap neg with neg HR HPV 2021 - screening mammograms recommended to start this year - colon cancer screening guidelines reviewed - vaccines updated - lab work done with Dr. Marisue Humble  2. Smoking - not ready to stop  3. Encounter for other general counseling or advice on contraception - pt is contemplating progesterone IUD.  She is currently taking micronor.  Rx to pharmacy in case she changes her mind.  4. Weight loss  5. Migraine without aura and without status migrainosus, not intractable

## 2020-06-12 MED ORDER — NORETHINDRONE 0.35 MG PO TABS: 1.0000 | ORAL_TABLET | Freq: Every day | ORAL | 3 refills | Status: DC

## 2020-07-01 ENCOUNTER — Other Ambulatory Visit: Payer: Self-pay

## 2020-07-01 ENCOUNTER — Ambulatory Visit (HOSPITAL_BASED_OUTPATIENT_CLINIC_OR_DEPARTMENT_OTHER)
Admission: RE | Admit: 2020-07-01 | Discharge: 2020-07-01 | Disposition: A | Discharge status: Home / Self Care | Payer: 59 | Source: Ambulatory Visit | Attending: Obstetrics & Gynecology | Admitting: Obstetrics & Gynecology

## 2020-07-01 DIAGNOSIS — Z1231 Encounter for screening mammogram for malignant neoplasm of breast: Secondary | ICD-10-CM | POA: Insufficient documentation

## 2020-07-02 ENCOUNTER — Telehealth (HOSPITAL_BASED_OUTPATIENT_CLINIC_OR_DEPARTMENT_OTHER): Payer: Self-pay | Admitting: Obstetrics & Gynecology

## 2020-07-02 NOTE — Telephone Encounter (Signed)
Please call pt.  She is on progesterone only pills so needs to continue until after IUD placed.  I think she wants a progesterone IUD.  She can be scheduled at any time as we are transitioning from one method to another.  Thanks.

## 2020-07-02 NOTE — Telephone Encounter (Signed)
Patient is called and wanted  to scheulded to have IUD done. Stated she do not need to be on her menstruation and is willing take pregnancy test the same day.

## 2020-07-03 NOTE — Telephone Encounter (Signed)
DOB verified. Called pt to advise that since she is taking progesterone only pills she should continue those until after her IUD is placed. Pt verbalized understanding and appt given.

## 2020-08-13 ENCOUNTER — Other Ambulatory Visit: Payer: Self-pay

## 2020-08-13 ENCOUNTER — Ambulatory Visit (HOSPITAL_BASED_OUTPATIENT_CLINIC_OR_DEPARTMENT_OTHER): Payer: 59 | Admitting: Obstetrics & Gynecology

## 2020-08-13 ENCOUNTER — Encounter (HOSPITAL_BASED_OUTPATIENT_CLINIC_OR_DEPARTMENT_OTHER): Payer: Self-pay | Admitting: Obstetrics & Gynecology

## 2020-08-13 VITALS — BP 103/69 | HR 69 | Ht 69.0 in | Wt 188.2 lb

## 2020-08-13 DIAGNOSIS — R634 Abnormal weight loss: Secondary | ICD-10-CM

## 2020-08-13 DIAGNOSIS — F17200 Nicotine dependence, unspecified, uncomplicated: Secondary | ICD-10-CM

## 2020-08-13 DIAGNOSIS — Z30430 Encounter for insertion of intrauterine contraceptive device: Secondary | ICD-10-CM

## 2020-08-13 NOTE — Progress Notes (Signed)
40 y.o. G80P1001 Married Caucasian female presents for insertion of IUD.  Pt has been counseled about alternative forms of contraception and she feels IUD is the better option for her.  She does smoke so cannot use estrogen containing methods.  Pt has also been counseled about risks and benefits as well as complications.  Consent is obtained today.  All questions answered prior to start of procedure.    Current contraception: micronor Last STD testing:  not indicated LMP:  Patient's last menstrual period was 08/11/2020.  Patient Active Problem List   Diagnosis Date Noted   Smoking 06/14/2019   Migraine without aura    Opioid dependence ([place]) 01/15/2014   Depression affecting pregnancy, postpartum 01/15/2014   Fibroid 05/24/2013   Past Medical History:  Diagnosis Date   Abnormal Pap smear    ASCUS   Abnormal TSH    Anxiety and depression    History of kidney stones    Migraine without aura    Opiate addiction ([place])    history occasional suboxone   Current Outpatient Medications on File Prior to Visit  Medication Sig Dispense Refill   ibuprofen (ADVIL,MOTRIN) 600 MG tablet Take 1 tablet (600 mg total) by mouth every 6 (six) hours. 30 tablet 0   norethindrone (MICRONOR) 0.35 MG tablet Take 1 tablet (0.35 mg total) by mouth daily. 84 tablet 3   REXULTI 2 MG TABS tablet Take 2 mg by mouth daily.     VIIBRYD 40 MG TABS Take 40 mg by mouth daily.     diclofenac (CATAFLAM) 50 MG tablet Take 50 mg by mouth 2 (two) times daily. (Patient not taking: No sig reported)     zonisamide (ZONEGRAN) 100 MG capsule Take 200 mg by mouth daily. (Patient not taking: No sig reported)     No current facility-administered medications on file prior to visit.   Dilaudid [hydromorphone hcl]  Review of Systems  All other systems reviewed and are negative. Vitals:   08/13/20 1558  BP: 103/69  Pulse: 69  Weight: 188 lb 3.2 oz (85.4 kg)  Height: 5\' 9"  (1.753 m)    Gen:  WNWF healthy female  NAD Abdomen: soft, non-tender Groin:  no inguinal nodes palpated  Pelvic exam: Vulva:  normal female genitalia Vagina:  normal vagina Cervix:  Non-tender, Negative CMT, no lesions or redness. Uterus:  normal shape, position and consistency   Procedure:  Speculum reinserted.  Cervix visualized and cleansed with Betadine x 3.  Paracervical block was not placed.  Single toothed tenaculum applied to anterior lip of cervix without difficulty.  Cervical os with stenotic and dilation was necessary.  This was done without difficulty.  Uterus sounded to 8cm.  Lot number: [id number].  Expiration: 09/2022.  IUD package was opened.  IUD and introducer passed to fundus and then withdrawn slightly before IUD was passed into endometrial cavity.  Introducer removed.  Strings cut to 2cm.  Tenaculum removed from cervix.  Minimal bleeding noted.  Pt tolerated the procedure well.  All instruments removed from vagina.  Assessment/Plan: 1. Encounter for IUD insertion - Return recheck 4-6 weeks - pt knows to call with any concerns - removal 7 years discussed  2. Encounter for insertion of intrauterine contraceptive device (IUD)  3. Smoking  4. Weight loss

## 2020-08-27 MED ORDER — LEVONORGESTREL 20 MCG/DAY IU IUD
1.0000 | INTRAUTERINE_SYSTEM | Freq: Once | INTRAUTERINE | Status: AC
  Administered 2020-08-13: 1 via INTRAUTERINE

## 2020-08-27 NOTE — Addendum Note (Signed)
Addended by: Blenda Nicely on: 08/27/2020 01:48 PM   Modules accepted: Orders

## 2020-09-24 ENCOUNTER — Other Ambulatory Visit: Payer: Self-pay

## 2020-09-24 ENCOUNTER — Ambulatory Visit (HOSPITAL_BASED_OUTPATIENT_CLINIC_OR_DEPARTMENT_OTHER): Payer: 59 | Admitting: Obstetrics & Gynecology

## 2020-09-24 ENCOUNTER — Encounter (HOSPITAL_BASED_OUTPATIENT_CLINIC_OR_DEPARTMENT_OTHER): Payer: Self-pay | Admitting: Obstetrics & Gynecology

## 2020-09-24 VITALS — BP 119/72 | HR 72 | Wt 182.2 lb

## 2020-09-24 DIAGNOSIS — Z30431 Encounter for routine checking of intrauterine contraceptive device: Secondary | ICD-10-CM

## 2020-09-24 DIAGNOSIS — Z975 Presence of (intrauterine) contraceptive device: Secondary | ICD-10-CM | POA: Diagnosis not present

## 2020-09-24 NOTE — Progress Notes (Signed)
40 y.o. G47P1001 Married Caucasian female presents for followed up after insertion of Mirena IUD placed on 08/13/2020.  Pt reports she is doing well but is having some spotting.  She had a cycle right after the IUD was placed.  Denies pelvic pain.  Has not been SA yet.  Has not tried ot feel strings.    LMP:  No LMP recorded. (Menstrual status: IUD).  Patient Active Problem List   Diagnosis Date Noted   Smoking 06/14/2019   Migraine without aura    Opioid dependence ([place]) 01/15/2014   Depression affecting pregnancy, postpartum 01/15/2014   Fibroid 05/24/2013   Past Medical History:  Diagnosis Date   Abnormal Pap smear    ASCUS   Abnormal TSH    Anxiety and depression    History of kidney stones    Migraine without aura    Opiate addiction ([place])    history occasional suboxone   Current Outpatient Medications on File Prior to Visit  Medication Sig Dispense Refill   diclofenac (CATAFLAM) 50 MG tablet Take 50 mg by mouth 2 (two) times daily. (Patient not taking: No sig reported)     ibuprofen (ADVIL,MOTRIN) 600 MG tablet Take 1 tablet (600 mg total) by mouth every 6 (six) hours. 30 tablet 0   norethindrone (MICRONOR) 0.35 MG tablet Take 1 tablet (0.35 mg total) by mouth daily. 84 tablet 3   REXULTI 2 MG TABS tablet Take 2 mg by mouth daily.     VIIBRYD 40 MG TABS Take 40 mg by mouth daily.     zonisamide (ZONEGRAN) 100 MG capsule Take 200 mg by mouth daily. (Patient not taking: No sig reported)     No current facility-administered medications on file prior to visit.   Dilaudid [hydromorphone hcl]  Review of Systems  Constitutional: Negative.   Gastrointestinal: Negative.   Genitourinary: Negative.    Vitals:   09/24/20 1336  BP: 119/72  Pulse: 72  Weight: 182 lb 3.2 oz (82.6 kg)    Gen:  WNWF healthy female NAD Abdomen: soft, non-tender Groin:  no inguinal nodes palpated  Pelvic exam: Vulva:  normal female genitalia Vagina:  normal vagina Cervix:  Non-tender, Negative  CMT, no lesions or redness.  IUD string noted and 2cm.   Uterus:  normal shape, position and consistency   Assessment/Plan 1. IUD check up - Typical spotting discussed and discussed reasons for calling back for recheck. - will scheduled exam for 1 year for wellness/gyn check

## 2021-08-12 ENCOUNTER — Ambulatory Visit (HOSPITAL_BASED_OUTPATIENT_CLINIC_OR_DEPARTMENT_OTHER): Payer: 59 | Admitting: Obstetrics & Gynecology

## 2021-10-04 ENCOUNTER — Ambulatory Visit (HOSPITAL_BASED_OUTPATIENT_CLINIC_OR_DEPARTMENT_OTHER): Payer: 59 | Admitting: Obstetrics & Gynecology

## 2021-11-25 ENCOUNTER — Ambulatory Visit (HOSPITAL_BASED_OUTPATIENT_CLINIC_OR_DEPARTMENT_OTHER): Payer: 59 | Admitting: Obstetrics & Gynecology

## 2021-12-27 ENCOUNTER — Other Ambulatory Visit (HOSPITAL_COMMUNITY)
Admission: RE | Admit: 2021-12-27 | Discharge: 2021-12-27 | Disposition: A | Discharge status: Home / Self Care | Payer: 59 | Source: Ambulatory Visit | Attending: Obstetrics & Gynecology | Admitting: Obstetrics & Gynecology

## 2021-12-27 ENCOUNTER — Ambulatory Visit (INDEPENDENT_AMBULATORY_CARE_PROVIDER_SITE_OTHER): Payer: 59 | Admitting: Obstetrics & Gynecology

## 2021-12-27 ENCOUNTER — Encounter (HOSPITAL_BASED_OUTPATIENT_CLINIC_OR_DEPARTMENT_OTHER): Payer: Self-pay | Admitting: Obstetrics & Gynecology

## 2021-12-27 VITALS — BP 133/86 | HR 103 | Ht 69.0 in | Wt 237.0 lb

## 2021-12-27 DIAGNOSIS — Z975 Presence of (intrauterine) contraceptive device: Secondary | ICD-10-CM

## 2021-12-27 DIAGNOSIS — Z01419 Encounter for gynecological examination (general) (routine) without abnormal findings: Secondary | ICD-10-CM

## 2021-12-27 DIAGNOSIS — F17200 Nicotine dependence, unspecified, uncomplicated: Secondary | ICD-10-CM | POA: Diagnosis not present

## 2021-12-27 DIAGNOSIS — Z124 Encounter for screening for malignant neoplasm of cervix: Secondary | ICD-10-CM | POA: Insufficient documentation

## 2021-12-27 NOTE — Progress Notes (Unsigned)
41 y.o. G56P1001 Married White or Caucasian female here for annual exam.  Does have regular menstrual cycles.  Flow is better then before having an IUD.  Placed 08/13/2020.    No LMP recorded. (Menstrual status: IUD).          Sexually active: Yes.    The current method of family planning is IUD.    Exercising: No.   Smoker:  yes  Health Maintenance: Pap:  03/14/2019 Negative History of abnormal Pap:  in her 20's MMG:  07/01/2020 Negative.  Pt is planning on having follow up. Colonoscopy:  guidelines reviewed Screening Labs: Dr. Marisue Humble   reports that she has been smoking. She has never used smokeless tobacco. She reports that she does not drink alcohol and does not use drugs.  Past Medical History:  Diagnosis Date   Abnormal Pap smear    ASCUS   Abnormal TSH    Anxiety and depression    History of kidney stones    Migraine without aura    Opiate addiction ([place])    history occasional suboxone    Past Surgical History:  Procedure Laterality Date   CHOLECYSTECTOMY  2008   COLPOSCOPY     negative    Current Outpatient Medications  Medication Sig Dispense Refill   ibuprofen (ADVIL,MOTRIN) 600 MG tablet Take 1 tablet (600 mg total) by mouth every 6 (six) hours. 30 tablet 0   lisdexamfetamine (VYVANSE) 50 MG capsule Take 50 mg by mouth daily.     REXULTI 2 MG TABS tablet Take 2 mg by mouth daily.     VIIBRYD 40 MG TABS Take 40 mg by mouth daily.     No current facility-administered medications for this visit.    Family History  Problem Relation Age of Onset   ALS Mother    Migraines Mother    Hypertension Maternal Aunt    Urolithiasis Maternal Aunt    Heart disease Maternal Grandfather    Hypertension Father    Migraines Maternal Grandmother     ROS: Constitutional: negative Genitourinary:negative  Exam:   BP 133/86 (BP Location: Right Arm, Patient Position: Sitting, Cuff Size: Large)   Pulse (!) 103   Ht '5\' 9"'$  (1.753 m) Comment: reported  Wt 237 lb (107.5 kg)    BMI 35.00 kg/m   Height: '5\' 9"'$  (175.3 cm) (reported)  General appearance: alert, cooperative and appears stated age Head: Normocephalic, without obvious abnormality, atraumatic Neck: no adenopathy, supple, symmetrical, trachea midline and thyroid normal to inspection and palpation Lungs: clear to auscultation bilaterally Breasts: normal appearance, no masses or tenderness Heart: regular rate and rhythm Abdomen: soft, non-tender; bowel sounds normal; no masses,  no organomegaly Extremities: extremities normal, atraumatic, no cyanosis or edema Skin: Skin color, texture, turgor normal. No rashes or lesions Lymph nodes: Cervical, supraclavicular, and axillary nodes normal. No abnormal inguinal nodes palpated Neurologic: Grossly normal   Pelvic: External genitalia:  no lesions              Urethra:  normal appearing urethra with no masses, tenderness or lesions              Bartholins and Skenes: normal                 Vagina: normal appearing vagina with normal color and no discharge, no lesions              Cervix: no lesions              Pap  taken: Yes.  , IUD string noted Bimanual Exam:  Uterus:  normal size, contour, position, consistency, mobility, non-tender              Adnexa: no mass, fullness, tenderness               Rectovaginal: Confirms               Anus:  normal sphincter tone, no lesions  Chaperone, Octaviano Batty, CMA, was present for exam.  Assessment/Plan: 1. Well woman exam with routine gynecological exam - Pap smear 03/14/2019 neg with neg HR HPV - Mammogram 07/01/2020.  Guidelines reviewed about having done yearly or every other year right now - Colonoscopy guidelines reviewed - lab work done with PCP, Dr. Marisue Humble - vaccines reviewed/updated  2. Cervical cancer screening - Cytology - PAP( [place])  3. Vaping nicotine dependence, non-tobacco product  4. IUD (intrauterine device) in place - placed 08/13/2020

## 2021-12-29 LAB — CYTOLOGY - PAP
Comment: NEGATIVE
Diagnosis: NEGATIVE
High risk HPV: NEGATIVE

## 2022-03-06 ENCOUNTER — Ambulatory Visit
Admission: EM | Admit: 2022-03-06 | Discharge: 2022-03-06 | Disposition: A | Discharge status: Home / Self Care | Payer: 59 | Attending: Emergency Medicine | Admitting: Emergency Medicine

## 2022-03-06 ENCOUNTER — Other Ambulatory Visit: Payer: Self-pay

## 2022-03-06 ENCOUNTER — Encounter: Payer: Self-pay | Admitting: Emergency Medicine

## 2022-03-06 DIAGNOSIS — L03116 Cellulitis of left lower limb: Secondary | ICD-10-CM | POA: Diagnosis not present

## 2022-03-06 MED ORDER — DOXYCYCLINE HYCLATE 100 MG PO CAPS: 100.0000 mg | ORAL_CAPSULE | Freq: Two times a day (BID) | ORAL | 0 refills | Status: DC

## 2022-03-06 MED ORDER — MELOXICAM 15 MG PO TABS: 15.0000 mg | ORAL_TABLET | Freq: Every day | ORAL | 0 refills | Status: DC

## 2022-03-06 NOTE — Discharge Instructions (Addendum)
Today you are being treated for a soft tissue infection to your left upper leg  Begin doxycycline every morning and every evening for 7 days, ideally you begin to see improvement in about 48 hours  You may use meloxicam every morning as needed for pain, may take this in addition to Tylenol  May use heat over the affected area in 10 to 15-minute intervals  May elevate leg when sitting and lying to help reduce swelling and for general comfort  If you have seen no improvement in your symptoms after 72 hours please follow-up for reevaluation

## 2022-03-06 NOTE — ED Triage Notes (Signed)
Pt here for red, painful, hard areas to upper left thigh x 3 days

## 2022-03-06 NOTE — ED Provider Notes (Signed)
[facility]    CSN: [id number] Arrival date & time: 03/06/22  0911      History   Chief Complaint Chief Complaint  Patient presents with   Leg Pain    HPI Lauren Harvey is a 42 y.o. female.   Patient presents for evaluation of 3 lumps present to the left upper thigh beginning 3 days ago.  Symptoms have worsened causing admits pain when walking and whenever touched, difficulty putting on her pants today.  Denies injury or trauma to the leg.  Lumps have increased in size and have become erythematous.  Looks similar in presentation to a sunburn but denies sun exposure.  Has attempted use of over-the-counter analgesic without relief.  Denies fever or drainage.    Past Medical History:  Diagnosis Date   Abnormal Pap smear    ASCUS   Abnormal TSH    Anxiety and depression    History of kidney stones    Migraine without aura    Opiate addiction ([place])    history occasional suboxone    Patient Active Problem List   Diagnosis Date Noted   IUD (intrauterine device) in place 09/24/2020   Smoking 06/14/2019   Migraine without aura    Opioid dependence in remission ([place]) 01/15/2014   Depression affecting pregnancy, postpartum 01/15/2014   Fibroid 05/24/2013    Past Surgical History:  Procedure Laterality Date   CHOLECYSTECTOMY  2008   COLPOSCOPY     negative    OB History     Gravida  1   Para  1   Term  1   Preterm  0   AB  0   Living  1      SAB  0   IAB  0   Ectopic  0   Multiple  0   Live Births  1            Home Medications    Prior to Admission medications   Medication Sig Start Date End Date Taking? Authorizing Provider  doxycycline (VIBRAMYCIN) 100 MG capsule Take 1 capsule (100 mg total) by mouth 2 (two) times daily. 03/06/22  Yes Angelea Penny R, NP  meloxicam (MOBIC) 15 MG tablet Take 1 tablet (15 mg total) by mouth daily. 03/06/22  Yes Jenavi Beedle R, NP  ibuprofen (ADVIL,MOTRIN) 600 MG tablet Take 1  tablet (600 mg total) by mouth every 6 (six) hours. 01/16/14   Artelia Laroche, CNM  lisdexamfetamine (VYVANSE) 50 MG capsule Take 50 mg by mouth daily.    [provider]  REXULTI 2 MG TABS tablet Take 2 mg by mouth daily. 03/07/19   [provider]  VIIBRYD 40 MG TABS Take 40 mg by mouth daily. 02/24/19   [provider]    Family History Family History  Problem Relation Age of Onset   ALS Mother    Migraines Mother    Hypertension Maternal Aunt    Urolithiasis Maternal Aunt    Heart disease Maternal Grandfather    Hypertension Father    Migraines Maternal Grandmother     Social History Social History   Tobacco Use   Smoking status: Every Day    Types: E-cigarettes   Smokeless tobacco: Never  Vaping Use   Vaping Use: Never used  Substance Use Topics   Alcohol use: No   Drug use: No     Allergies   Dilaudid [hydromorphone hcl]   Review of Systems Review of Systems  Physical Exam Triage Vital Signs ED Triage Vitals [03/06/22 0952]  Enc Vitals Group     BP 127/81     Pulse Rate 87     Resp 18     Temp 98.2 F (36.8 C)     Temp Source Oral     SpO2 95 %     Weight      Height      Head Circumference      Peak Flow      Pain Score 7     Pain Loc      Pain Edu?      Excl. in [place]?    No data found.  Updated Vital Signs BP 127/81 (BP Location: Left Arm)   Pulse 87   Temp 98.2 F (36.8 C) (Oral)   Resp 18   SpO2 95%   Visual Acuity Right Eye Distance:   Left Eye Distance:   Bilateral Distance:    Right Eye Near:   Left Eye Near:    Bilateral Near:     Physical Exam Constitutional:      Appearance: Normal appearance.  Eyes:     Extraocular Movements: Extraocular movements intact.  Pulmonary:     Effort: Pulmonary effort is normal.  Skin:    Comments: Refer to photo below  Neurological:     Mental Status: She is alert and oriented to person, place, and time. Mental status is at baseline.      UC Treatments /  Results  Labs (all labs ordered are listed, but only abnormal results are displayed) Labs Reviewed - No data to display  EKG   Radiology No results found.  Procedures Procedures (including critical care time)  Medications Ordered in UC Medications - No data to display  Initial Impression / Assessment and Plan / UC Course  I have reviewed the triage vital signs and the nursing notes.  Pertinent labs & imaging results that were available during my care of the patient were reviewed by me and considered in my medical decision making (see chart for details).  Cellulitis of left leg  Skin is erythematous, firm, tender and hot to touch, symptoms and presentation consistent with infection, discussed with patient, prescribed doxycycline and meloxicam for management of pain, recommended warm to the skin for general comfort as well as elevation,  strict precautions given  if no improvement seen within 72 hours to return for reevaluation Final Clinical Impressions(s) / UC Diagnoses   Final diagnoses:  Cellulitis of leg, left     Discharge Instructions          ED Prescriptions     Medication Sig Dispense Auth. Provider   meloxicam (MOBIC) 15 MG tablet Take 1 tablet (15 mg total) by mouth daily. 30 tablet [name] R, NP   doxycycline (VIBRAMYCIN) 100 MG capsule Take 1 capsule (100 mg total) by mouth 2 (two) times daily. 14 capsule Ioan Landini, Leitha Schuller, NP      PDMP not reviewed this encounter.   Hans Eden, NP 03/06/22 1153

## 2022-03-08 ENCOUNTER — Other Ambulatory Visit (HOSPITAL_BASED_OUTPATIENT_CLINIC_OR_DEPARTMENT_OTHER): Payer: Self-pay

## 2022-03-08 MED ORDER — LISDEXAMFETAMINE DIMESYLATE 50 MG PO CAPS
50.0000 mg | ORAL_CAPSULE | Freq: Every morning | ORAL | 0 refills | Status: DC
  Filled 2022-03-08 – 2022-03-19 (×2): qty 30, 30d supply, fill #0

## 2022-03-09 ENCOUNTER — Other Ambulatory Visit (HOSPITAL_BASED_OUTPATIENT_CLINIC_OR_DEPARTMENT_OTHER): Payer: Self-pay

## 2022-03-10 ENCOUNTER — Other Ambulatory Visit (HOSPITAL_COMMUNITY): Payer: Self-pay | Admitting: *Deleted

## 2022-03-10 ENCOUNTER — Other Ambulatory Visit (HOSPITAL_BASED_OUTPATIENT_CLINIC_OR_DEPARTMENT_OTHER): Payer: Self-pay

## 2022-03-10 ENCOUNTER — Other Ambulatory Visit (HOSPITAL_COMMUNITY): Payer: Self-pay

## 2022-03-10 ENCOUNTER — Ambulatory Visit (HOSPITAL_BASED_OUTPATIENT_CLINIC_OR_DEPARTMENT_OTHER)
Admission: RE | Admit: 2022-03-10 | Discharge: 2022-03-10 | Disposition: A | Discharge status: Home / Self Care | Payer: 59 | Source: Ambulatory Visit | Attending: *Deleted | Admitting: *Deleted

## 2022-03-10 DIAGNOSIS — M7989 Other specified soft tissue disorders: Secondary | ICD-10-CM

## 2022-03-19 ENCOUNTER — Other Ambulatory Visit (HOSPITAL_BASED_OUTPATIENT_CLINIC_OR_DEPARTMENT_OTHER): Payer: Self-pay

## 2022-04-01 ENCOUNTER — Other Ambulatory Visit (HOSPITAL_BASED_OUTPATIENT_CLINIC_OR_DEPARTMENT_OTHER): Payer: Self-pay

## 2022-05-20 ENCOUNTER — Ambulatory Visit: Payer: 59

## 2022-05-20 ENCOUNTER — Ambulatory Visit
Admission: RE | Admit: 2022-05-20 | Discharge: 2022-05-20 | Disposition: A | Discharge status: Home / Self Care | Payer: 59 | Source: Ambulatory Visit

## 2022-05-20 VITALS — BP 128/76 | HR 84 | Temp 98.4°F | Resp 18

## 2022-05-20 DIAGNOSIS — J069 Acute upper respiratory infection, unspecified: Secondary | ICD-10-CM | POA: Diagnosis not present

## 2022-05-20 DIAGNOSIS — H6121 Impacted cerumen, right ear: Secondary | ICD-10-CM

## 2022-05-20 DIAGNOSIS — H6593 Unspecified nonsuppurative otitis media, bilateral: Secondary | ICD-10-CM | POA: Diagnosis not present

## 2022-05-20 DIAGNOSIS — L03116 Cellulitis of left lower limb: Secondary | ICD-10-CM

## 2022-05-20 DIAGNOSIS — H9201 Otalgia, right ear: Secondary | ICD-10-CM

## 2022-05-20 MED ORDER — CLINDAMYCIN HCL 150 MG PO CAPS: 450.0000 mg | ORAL_CAPSULE | Freq: Three times a day (TID) | ORAL | 0 refills | Status: AC

## 2022-05-20 MED ORDER — FLUTICASONE PROPIONATE 50 MCG/ACT NA SUSP: 1.0000 | Freq: Every day | NASAL | 0 refills | Status: DC

## 2022-05-20 NOTE — ED Provider Notes (Signed)
[facility]    CSN: [id number] Arrival date & time: 05/20/22  1214      History   Chief Complaint Chief Complaint  Patient presents with   Nasal Congestion    Sinus and face pressure Ears feel full and are itchingSinus Headache No fever - Entered by patient    HPI Lauren Harvey is a 42 y.o. female.   Patient presents with 4-day history of nasal congestion, ear pain, and sinus pressure.  Reports very minimal and nonproductive coughing.  Denies fever.  Reports her son had similar symptoms but they were attributing it to the allergies.  Denies chest pain, shortness of breath, gastrointestinal symptoms.  Patient has taken Mucinex, Sudafed, Tylenol Sinus with no improvement in symptoms.  Denies history of asthma.  Patient also reports concerns for cellulitis to the left inner leg that started today.  Patient reports that she was diagnosed with cellulitis in the same area in January.  She reports it was much worse but she is concerned that it could get to that point so she would like evaluation and possible treatment today.  She denies any associated fever, chills.  Denies any injury to the area.  States that she took doxycycline in January with resolution and also followed up with PCP to ensure infection cleared which it did.     Past Medical History:  Diagnosis Date   Abnormal Pap smear    ASCUS   Abnormal TSH    Anxiety and depression    History of kidney stones    Migraine without aura    Opiate addiction    history occasional suboxone    Patient Active Problem List   Diagnosis Date Noted   IUD (intrauterine device) in place 09/24/2020   Smoking 06/14/2019   Migraine without aura    Opioid dependence in remission 01/15/2014   Depression affecting pregnancy, postpartum 01/15/2014   Fibroid 05/24/2013    Past Surgical History:  Procedure Laterality Date   CHOLECYSTECTOMY  2008   COLPOSCOPY     negative    OB History     Gravida  1   Para  1    Term  1   Preterm  0   AB  0   Living  1      SAB  0   IAB  0   Ectopic  0   Multiple  0   Live Births  1            Home Medications    Prior to Admission medications   Medication Sig Start Date End Date Taking? Authorizing Provider  buPROPion (WELLBUTRIN XL) 150 MG 24 hr tablet Take 150 mg by mouth every morning. 04/29/22  Yes [provider]  clindamycin (CLEOCIN) 150 MG capsule Take 3 capsules (450 mg total) by mouth 3 (three) times daily for 5 days. 05/20/22 05/25/22 Yes , Acie FredricksonHaley E, FNP  fluticasone (FLONASE) 50 MCG/ACT nasal spray Place 1 spray into both nostrils daily. 05/20/22  Yes , Acie FredricksonHaley E, FNP  Buprenorphine HCl-Naloxone HCl 8-2 MG FILM Place 8 mg of opioid under the tongue.    [provider]  ibuprofen (ADVIL,MOTRIN) 600 MG tablet Take 1 tablet (600 mg total) by mouth every 6 (six) hours. 01/16/14   Marlinda MikeBailey, Tanya, CNM  lisdexamfetamine (VYVANSE) 50 MG capsule Take 50 mg by mouth daily.    [provider]  meloxicam (MOBIC) 15 MG tablet Take 1 tablet (15 mg total) by mouth daily. 03/06/22  White, Adrienne R, NP  REXULTI 2 MG TABS tablet Take 2 mg by mouth daily. 03/07/19   [provider]  VIIBRYD 40 MG TABS Take 40 mg by mouth daily. 02/24/19   [provider]    Family History Family History  Problem Relation Age of Onset   ALS Mother    Migraines Mother    Hypertension Maternal Aunt    Urolithiasis Maternal Aunt    Heart disease Maternal Grandfather    Hypertension Father    Migraines Maternal Grandmother     Social History Social History   Tobacco Use   Smoking status: Every Day    Types: E-cigarettes   Smokeless tobacco: Never  Vaping Use   Vaping Use: Never used  Substance Use Topics   Alcohol use: No   Drug use: No     Allergies   Paroxetine and Dilaudid [hydromorphone hcl]   Review of Systems Review of Systems Per HPI  Physical Exam Triage Vital Signs ED Triage Vitals  Enc  Vitals Group     BP 05/20/22 1228 128/76     Pulse Rate 05/20/22 1228 84     Resp 05/20/22 1228 18     Temp 05/20/22 1228 98.4 F (36.9 C)     Temp Source 05/20/22 1228 Oral     SpO2 05/20/22 1228 94 %     Weight --      Height --      Head Circumference --      Peak Flow --      Pain Score 05/20/22 1229 6     Pain Loc --      Pain Edu? --      Excl. in [place]? --    No data found.  Updated Vital Signs BP 128/76 (BP Location: Left Arm)   Pulse 84   Temp 98.4 F (36.9 C) (Oral)   Resp 18   LMP 05/09/2022 (Within Days)   SpO2 95%   Breastfeeding No   Visual Acuity Right Eye Distance:   Left Eye Distance:   Bilateral Distance:    Right Eye Near:   Left Eye Near:    Bilateral Near:     Physical Exam Constitutional:      General: She is not in acute distress.    Appearance: Normal appearance.  HENT:     Head: Normocephalic and atraumatic.     Right Ear: Ear canal normal. No drainage, swelling or tenderness. A middle ear effusion is present. There is impacted cerumen. Tympanic membrane is not perforated, erythematous or bulging.     Left Ear: Ear canal normal. No drainage, swelling or tenderness. A middle ear effusion is present. There is no impacted cerumen. Tympanic membrane is not perforated, erythematous or bulging.     Ears:     Comments: Impacted cerumen to the right external canal.  Ear was irrigated successfully with complete removal of cerumen.  On second physical exam, patient has fluid behind TM but no erythema.    Nose: Congestion present.     Mouth/Throat:     Mouth: Mucous membranes are moist.     Pharynx: No posterior oropharyngeal erythema.  Eyes:     Extraocular Movements: Extraocular movements intact.     Conjunctiva/sclera: Conjunctivae normal.     Pupils: Pupils are equal, round, and reactive to light.  Cardiovascular:     Rate and Rhythm: Normal rate and regular rhythm.     Pulses: Normal pulses.     Heart sounds:  Normal heart sounds.  Pulmonary:      Effort: Pulmonary effort is normal. No respiratory distress.     Breath sounds: Normal breath sounds. No stridor. No wheezing, rhonchi or rales.  Abdominal:     General: Abdomen is flat. Bowel sounds are normal.     Palpations: Abdomen is soft.  Musculoskeletal:        General: Normal range of motion.     Cervical back: Normal range of motion.  Skin:    General: Skin is warm and dry.     Comments: Patient has small area of erythema present to left inner thigh directly above left knee.  There is no abscess or area of induration.  Area of erythema extends approximately 2 to 3 inches.  Neurological:     General: No focal deficit present.     Mental Status: She is alert and oriented to person, place, and time. Mental status is at baseline.  Psychiatric:        Mood and Affect: Mood normal.        Behavior: Behavior normal.      UC Treatments / Results  Labs (all labs ordered are listed, but only abnormal results are displayed) Labs Reviewed - No data to display  EKG   Radiology No results found.  Procedures Procedures (including critical care time)  Medications Ordered in UC Medications - No data to display  Initial Impression / Assessment and Plan / UC Course  I have reviewed the triage vital signs and the nursing notes.  Pertinent labs & imaging results that were available during my care of the patient were reviewed by me and considered in my medical decision making (see chart for details).     1.  Viral upper respiratory infection  Patient presents with symptoms likely from a viral upper respiratory infection. Do not suspect underlying cardiopulmonary process. Symptoms seem unlikely related to ACS, CHF or COPD exacerbations, pneumonia, pneumothorax. Patient is nontoxic appearing and not in need of emergent medical intervention.  Patient declined COVID testing. Recommended symptom control with medications and supportive care.  Patient was sent Flonase to help  alleviate fluid behind TMs as this is the safest medication at this time given patient's daily medications.  2.  Impacted cerumen  Ear was irrigated successfully with complete removal of cerumen.  Advised to follow-up if ear pain persists or worsens.  Additional ear pain is most likely due to fluid behind TMs and Flonase was sent to help treat this.  3.  Cellulitis of left thigh  Infection is very minimal with no systemic complications.  Patient reports that it was a lot more severe in January and she is afraid that it is going to turn into this.  Therefore, will treat with antibiotic.  Patient had doxycycline in January so will choose clindamycin to have MRSA coverage.  Advised patient to take this medication with food to avoid stomach upset.  Encouraged follow-up with PCP given that this is a recurrent infection.  Return if symptoms fail to improve. Patient states understanding and is agreeable.  Discharged with PCP followup.  Final Clinical Impressions(s) / UC Diagnoses   Final diagnoses:  Viral upper respiratory tract infection with cough  Impacted cerumen, right ear  Fluid level behind tympanic membrane of both ears  Cellulitis of left thigh     Discharge Instructions      I have prescribed an antibiotic for cellulitis.  Monitor closely for any worsening infection.  Follow-up with primary care doctor  for this.  It appears that you have a viral upper respiratory infection which should run its course.  I have prescribed Flonase.  Ensure adequate fluids and rest.  Follow-up if any symptoms persist or worsen.     ED Prescriptions     Medication Sig Dispense Auth. Provider   clindamycin (CLEOCIN) 150 MG capsule Take 3 capsules (450 mg total) by mouth 3 (three) times daily for 5 days. 45 capsule [place], [place] E, [place]   fluticasone [facility]) 50 MCG/ACT nasal spray Place 1 spray into both nostrils daily. 16 g Gustavus Bryant, [place]      PDMP not reviewed this encounter.   Gustavus Bryant, [place] 05/20/22 1315

## 2022-05-20 NOTE — ED Triage Notes (Signed)
Pt c/o Sinus and face pressure, Ears feel full and are itching, Sinus Headache, lightheaded, bodyache (Mostly in back) No fever x 4 days   Pt has last taken mucinex and Sudafed, tylenol sinus and tylenol, meds have helped some but have not alleviated sx completely.

## 2022-05-20 NOTE — Discharge Instructions (Signed)
I have prescribed an antibiotic for cellulitis.  Monitor closely for any worsening infection.  Follow-up with primary care doctor for this.  It appears that you have a viral upper respiratory infection which should run its course.  I have prescribed Flonase.  Ensure adequate fluids and rest.  Follow-up if any symptoms persist or worsen.

## 2022-05-21 ENCOUNTER — Ambulatory Visit: Payer: 59

## 2022-10-20 ENCOUNTER — Telehealth (HOSPITAL_BASED_OUTPATIENT_CLINIC_OR_DEPARTMENT_OTHER): Payer: Self-pay | Admitting: *Deleted

## 2022-10-20 NOTE — Telephone Encounter (Signed)
Patient called left a message that she needs be seen as soon as possible and would like for the nurse to please call her .

## 2022-10-21 NOTE — Telephone Encounter (Signed)
Pt requests an appointment to discuss pelvic pain that she has been having on one side. She would also like to discuss menopause. Pt provided with appt.

## 2022-11-15 ENCOUNTER — Encounter (HOSPITAL_BASED_OUTPATIENT_CLINIC_OR_DEPARTMENT_OTHER): Payer: Self-pay | Admitting: Obstetrics & Gynecology

## 2022-11-15 ENCOUNTER — Ambulatory Visit (HOSPITAL_BASED_OUTPATIENT_CLINIC_OR_DEPARTMENT_OTHER): Payer: 59 | Admitting: Obstetrics & Gynecology

## 2022-11-15 VITALS — BP 122/75 | HR 85 | Ht 69.0 in | Wt 243.6 lb

## 2022-11-15 DIAGNOSIS — R6889 Other general symptoms and signs: Secondary | ICD-10-CM | POA: Diagnosis not present

## 2022-11-15 DIAGNOSIS — R1032 Left lower quadrant pain: Secondary | ICD-10-CM

## 2022-11-15 DIAGNOSIS — K5909 Other constipation: Secondary | ICD-10-CM

## 2022-11-15 DIAGNOSIS — N926 Irregular menstruation, unspecified: Secondary | ICD-10-CM | POA: Diagnosis not present

## 2022-11-15 DIAGNOSIS — R4586 Emotional lability: Secondary | ICD-10-CM

## 2022-11-15 NOTE — Progress Notes (Addendum)
GYNECOLOGY  VISIT  CC:   questions about menopause, LLQ pain  HPI: 42 y.o. G21P1001 Married White or Caucasian female here for questions about intermittent LLQ pain that she reports has been going on for about 6 months.  She doesn't think this is associated with her menstrual cycles.  When the pain occurs, it can last for 30 minutes or more.  Eventually, the pain resolves.  She has chronic constipation but has always been this way.  She has tried probiotics in the last spring and early summer.  She reports this didn't help with improving constipation but did seem to help with pain with bowel movements.    Still having monthly menstrual cycles.  Flow is lighter than it used to be.  Flow lasts 4- 5 days.  She has noticed some mid cycle spotting that has been present this past year.  Had neg pap with neg HR HPV 12/27/2021.    She has noticed heat intolerance that is present almost all of the time.  She does wake up at night feeling hot at times.  She is also having mood swings as well.  Past Medical History:  Diagnosis Date   Abnormal Pap smear    ASCUS   Abnormal TSH    Anxiety and depression    History of kidney stones    Migraine without aura    Opiate addiction ([facility])    history occasional suboxone    MEDS:   Current Outpatient Medications on File Prior to Visit  Medication Sig Dispense Refill   Buprenorphine HCl-Naloxone HCl 8-2 MG FILM Place 8 mg of opioid under the tongue.     buPROPion (WELLBUTRIN XL) 150 MG 24 hr tablet Take 150 mg by mouth every morning.     ibuprofen (ADVIL,MOTRIN) 600 MG tablet Take 1 tablet (600 mg total) by mouth every 6 (six) hours. 30 tablet 0   lisdexamfetamine (VYVANSE) 50 MG capsule Take 50 mg by mouth daily.     VIIBRYD 40 MG TABS Take 40 mg by mouth daily.     No current facility-administered medications on file prior to visit.    ALLERGIES: Paroxetine and Dilaudid [hydromorphone hcl]  SH:  married, non smoker  Review of Systems   Constitutional: Negative.   Gastrointestinal:  Positive for abdominal pain and constipation.    PHYSICAL EXAMINATION:    BP 122/75 (BP Location: Right Arm, Patient Position: Sitting, Cuff Size: Large)   Pulse 85   Ht 5\' 9"  (1.753 m) Comment: Reported  Wt 243 lb 9.6 oz (110.5 kg)   BMI 35.97 kg/m     General appearance: alert, cooperative and appears stated age Abdomen: soft, mild LLQ tenderness; bowel sounds normal; no masses,  no organomegaly Lymph:  no inguinal LAD noted  Pelvic: External genitalia:  no lesions              Urethra:  normal appearing urethra with no masses, tenderness or lesions              Bartholins and Skenes: normal                 Vagina: normal mucosa without prolapse or lesions              Cervix: no lesions              Bimanual Exam:  Uterus:  normal size, contour, position, consistency, mobility, non-tender              Adnexa: no  mass, fullness, tenderness  Chaperone, Lauren Harvey, CMA, was present for exam.  Assessment/Plan: 1. LLQ pain - pt will return for ultrasound.  Suspect chronic constipation is part of this as well but will r/o ovarian pathology  2. Chronic constipation - consider GI referral for treatment discussed - Colace and/or miralax and probiotic use discussed  3. Irregular bleeding - US PELVIS TRANSVAGINAL NON-OB (TV ONLY); Future  4. Heat intolerance - Follicle stimulating hormone - Estradiol  5. Mood changes - may need to consider medications depending on results from lab work

## 2022-11-15 NOTE — Patient Instructions (Signed)
Colace 100mg  twice daily  miralax  Probiotic Align is a good idea

## 2022-11-17 LAB — ESTRADIOL: Estradiol: 81.8 pg/mL

## 2022-11-17 LAB — FOLLICLE STIMULATING HORMONE: FSH: 5.9 m[IU]/mL

## 2022-11-30 ENCOUNTER — Ambulatory Visit (HOSPITAL_BASED_OUTPATIENT_CLINIC_OR_DEPARTMENT_OTHER): Payer: 59 | Admitting: Obstetrics & Gynecology

## 2022-11-30 ENCOUNTER — Ambulatory Visit (HOSPITAL_BASED_OUTPATIENT_CLINIC_OR_DEPARTMENT_OTHER): Payer: 59

## 2022-11-30 ENCOUNTER — Encounter (HOSPITAL_BASED_OUTPATIENT_CLINIC_OR_DEPARTMENT_OTHER): Payer: Self-pay | Admitting: Obstetrics & Gynecology

## 2022-11-30 VITALS — BP 123/69 | HR 77 | Ht 69.0 in | Wt 243.6 lb

## 2022-11-30 DIAGNOSIS — Z8742 Personal history of other diseases of the female genital tract: Secondary | ICD-10-CM | POA: Diagnosis not present

## 2022-11-30 DIAGNOSIS — D251 Intramural leiomyoma of uterus: Secondary | ICD-10-CM

## 2022-11-30 DIAGNOSIS — T8332XA Displacement of intrauterine contraceptive device, initial encounter: Secondary | ICD-10-CM

## 2022-11-30 DIAGNOSIS — N926 Irregular menstruation, unspecified: Secondary | ICD-10-CM

## 2022-11-30 DIAGNOSIS — D219 Benign neoplasm of connective and other soft tissue, unspecified: Secondary | ICD-10-CM

## 2022-11-30 DIAGNOSIS — R1032 Left lower quadrant pain: Secondary | ICD-10-CM | POA: Diagnosis not present

## 2022-11-30 NOTE — Progress Notes (Signed)
GYNECOLOGY  VISIT  CC:   discuss ultrasound results, LLQ pain  HPI: 42 y.o. G35P1001 Married White or Caucasian female here for discussion of ultrasound being done due to LLQ pain.  Pt has IUD for contraception and bleeding.  IUD is in lower portion of uterus.  Positioning discussed with pt.  Unsure how much of this is contributing to her pain.  She has started using a probiotic/prebiotic without much change in symptoms.  Single fibroid noted.  Bleeding is much better with IUD.  Offered removal/replacement.  She thinks she wants to do this but will consider and let me know.   Past Medical History:  Diagnosis Date   Abnormal Pap smear    ASCUS   Abnormal TSH    Anxiety and depression    History of kidney stones    Migraine without aura    Opiate addiction ([facility])    history occasional suboxone    MEDS:   Current Outpatient Medications on File Prior to Visit  Medication Sig Dispense Refill   Buprenorphine HCl-Naloxone HCl 8-2 MG FILM Place 8 mg of opioid under the tongue.     buPROPion (WELLBUTRIN XL) 150 MG 24 hr tablet Take 150 mg by mouth every morning.     ibuprofen (ADVIL,MOTRIN) 600 MG tablet Take 1 tablet (600 mg total) by mouth every 6 (six) hours. 30 tablet 0   lisdexamfetamine (VYVANSE) 50 MG capsule Take 50 mg by mouth daily.     VIIBRYD 40 MG TABS Take 40 mg by mouth daily.     No current facility-administered medications on file prior to visit.    ALLERGIES: Paroxetine and Dilaudid [hydromorphone hcl]  SH:  married, non smoker  Review of Systems  Constitutional: Negative.   Gastrointestinal:        LLQ pain    PHYSICAL EXAMINATION:    BP 123/69 (BP Location: Right Arm, Patient Position: Sitting, Cuff Size: Large)   Pulse 77   Ht 5\' 9"  (1.753 m) Comment: Reported  Wt 243 lb 9.6 oz (110.5 kg)   BMI 35.97 kg/m     Physical Exam Constitutional:      Appearance: Normal appearance.  Neurological:     General: No focal deficit present.     Mental Status: She is  alert.  Psychiatric:        Mood and Affect: Mood normal.        Behavior: Behavior normal.       Assessment/Plan: 1. Malpositioned intrauterine device (IUD), initial encounter - offered removal and replacement.  She will consider.  If decides to have this removed, would recommend cytotec prior to procedure, possible valium as well as Ibuprofen.  2. Fibroid - would not recommend treatment for this at this time  3. History of menorrhagia  4. LLQ pain - she has started a probiotic/prebiotic and will try either colace or miralax  Total time with pt:  27 minutes

## 2022-12-16 ENCOUNTER — Other Ambulatory Visit (HOSPITAL_BASED_OUTPATIENT_CLINIC_OR_DEPARTMENT_OTHER): Payer: Self-pay

## 2022-12-16 MED ORDER — LISDEXAMFETAMINE DIMESYLATE 50 MG PO CAPS
50.0000 mg | ORAL_CAPSULE | Freq: Every day | ORAL | 0 refills | Status: DC
  Filled 2022-12-16: qty 30, 30d supply, fill #0

## 2022-12-21 ENCOUNTER — Other Ambulatory Visit (HOSPITAL_BASED_OUTPATIENT_CLINIC_OR_DEPARTMENT_OTHER): Payer: Self-pay

## 2022-12-21 MED ORDER — LISDEXAMFETAMINE DIMESYLATE 50 MG PO CAPS
50.0000 mg | ORAL_CAPSULE | Freq: Every day | ORAL | 0 refills | Status: DC
  Filled 2023-02-06 – 2023-02-10 (×3): qty 30, 30d supply, fill #0

## 2022-12-21 MED ORDER — LISDEXAMFETAMINE DIMESYLATE 50 MG PO CAPS
50.0000 mg | ORAL_CAPSULE | Freq: Every day | ORAL | 0 refills | Status: AC
  Filled 2022-12-21 – 2023-01-13 (×2): qty 30, 30d supply, fill #0

## 2023-01-10 ENCOUNTER — Other Ambulatory Visit (HOSPITAL_BASED_OUTPATIENT_CLINIC_OR_DEPARTMENT_OTHER): Payer: Self-pay

## 2023-01-13 ENCOUNTER — Other Ambulatory Visit (HOSPITAL_BASED_OUTPATIENT_CLINIC_OR_DEPARTMENT_OTHER): Payer: Self-pay

## 2023-02-06 ENCOUNTER — Other Ambulatory Visit (HOSPITAL_BASED_OUTPATIENT_CLINIC_OR_DEPARTMENT_OTHER): Payer: Self-pay

## 2023-02-09 ENCOUNTER — Other Ambulatory Visit (HOSPITAL_BASED_OUTPATIENT_CLINIC_OR_DEPARTMENT_OTHER): Payer: Self-pay

## 2023-02-10 ENCOUNTER — Other Ambulatory Visit: Payer: Self-pay

## 2023-03-10 ENCOUNTER — Other Ambulatory Visit (HOSPITAL_BASED_OUTPATIENT_CLINIC_OR_DEPARTMENT_OTHER): Payer: Self-pay

## 2023-03-10 MED ORDER — LISDEXAMFETAMINE DIMESYLATE 50 MG PO CAPS
50.0000 mg | ORAL_CAPSULE | Freq: Every day | ORAL | 0 refills | Status: AC
  Filled 2023-03-10: qty 30, 30d supply, fill #0

## 2023-03-23 ENCOUNTER — Other Ambulatory Visit (HOSPITAL_BASED_OUTPATIENT_CLINIC_OR_DEPARTMENT_OTHER): Payer: Self-pay

## 2023-03-23 MED ORDER — LISDEXAMFETAMINE DIMESYLATE 50 MG PO CAPS
50.0000 mg | ORAL_CAPSULE | Freq: Every day | ORAL | 0 refills | Status: AC
  Filled 2023-04-08: qty 30, 30d supply, fill #0

## 2023-03-23 MED ORDER — LISDEXAMFETAMINE DIMESYLATE 50 MG PO CAPS
50.0000 mg | ORAL_CAPSULE | Freq: Every day | ORAL | 0 refills | Status: DC
  Filled 2023-06-03: qty 30, 30d supply, fill #0

## 2023-03-23 MED ORDER — LISDEXAMFETAMINE DIMESYLATE 50 MG PO CAPS
50.0000 mg | ORAL_CAPSULE | Freq: Every day | ORAL | 0 refills | Status: AC
  Filled 2023-05-03 – 2023-05-05 (×4): qty 30, 30d supply, fill #0
  Filled ????-??-??: fill #0

## 2023-04-07 ENCOUNTER — Other Ambulatory Visit (HOSPITAL_BASED_OUTPATIENT_CLINIC_OR_DEPARTMENT_OTHER): Payer: Self-pay

## 2023-04-08 ENCOUNTER — Other Ambulatory Visit (HOSPITAL_BASED_OUTPATIENT_CLINIC_OR_DEPARTMENT_OTHER): Payer: Self-pay

## 2023-04-13 IMAGING — MG MM DIGITAL SCREENING BILAT W/ TOMO AND CAD
8 series · 8 of 24 positions shown · non-contrast
Comparison: Previous exam(s).

CLINICAL DATA: Screening.

EXAM:
DIGITAL SCREENING BILATERAL MAMMOGRAM WITH TOMOSYNTHESIS AND CAD
TECHNIQUE: Bilateral screening digital craniocaudal and mediolateral oblique
mammograms were obtained. Bilateral screening digital breast
tomosynthesis was performed. The images were evaluated with
computer-aided detection.

[R MLO synth-2D]
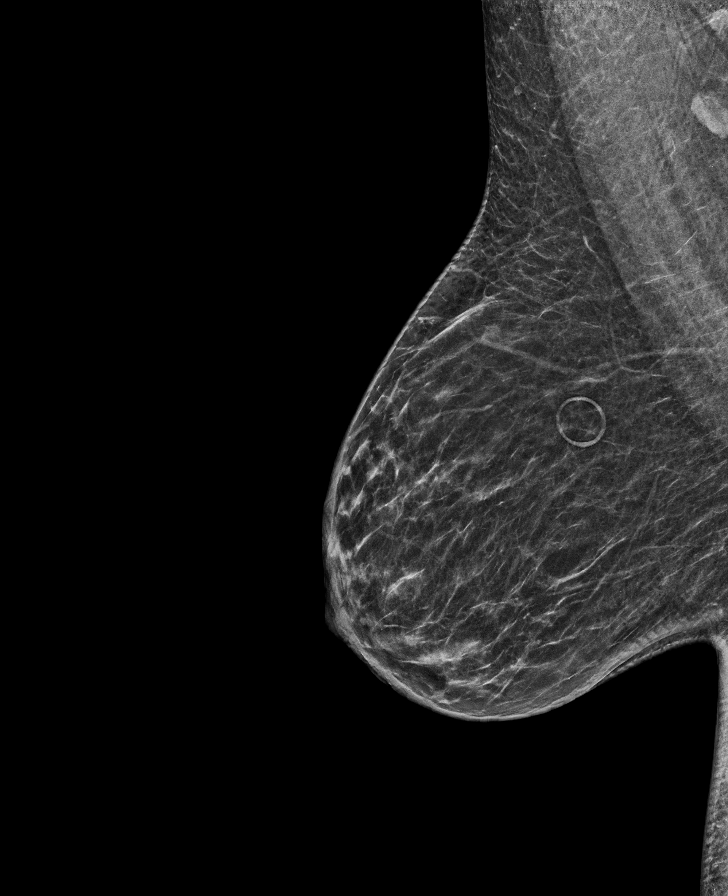

[R CC synth-2D]
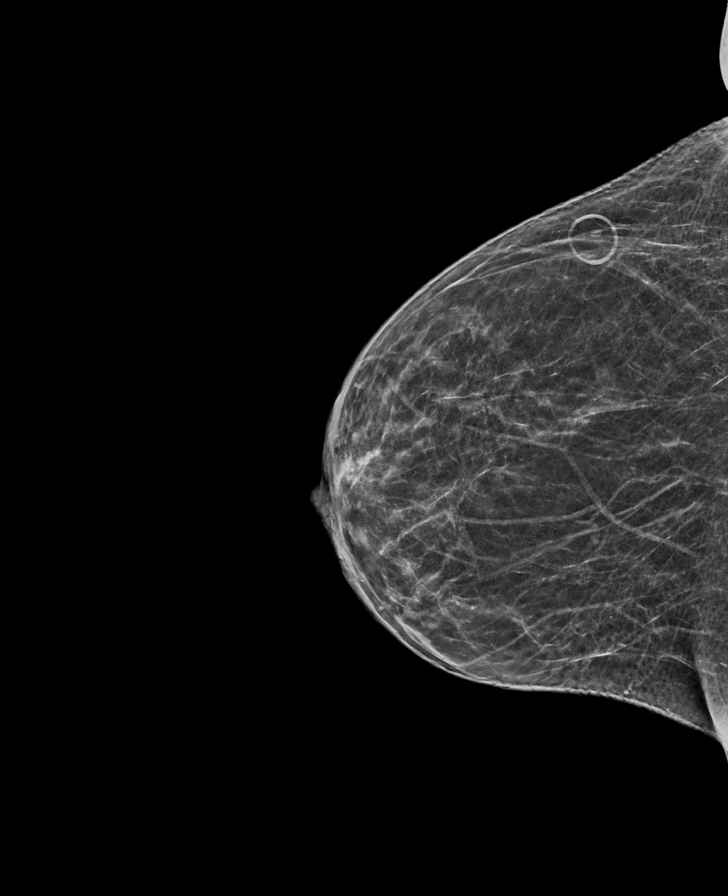

[L MLO synth-2D]
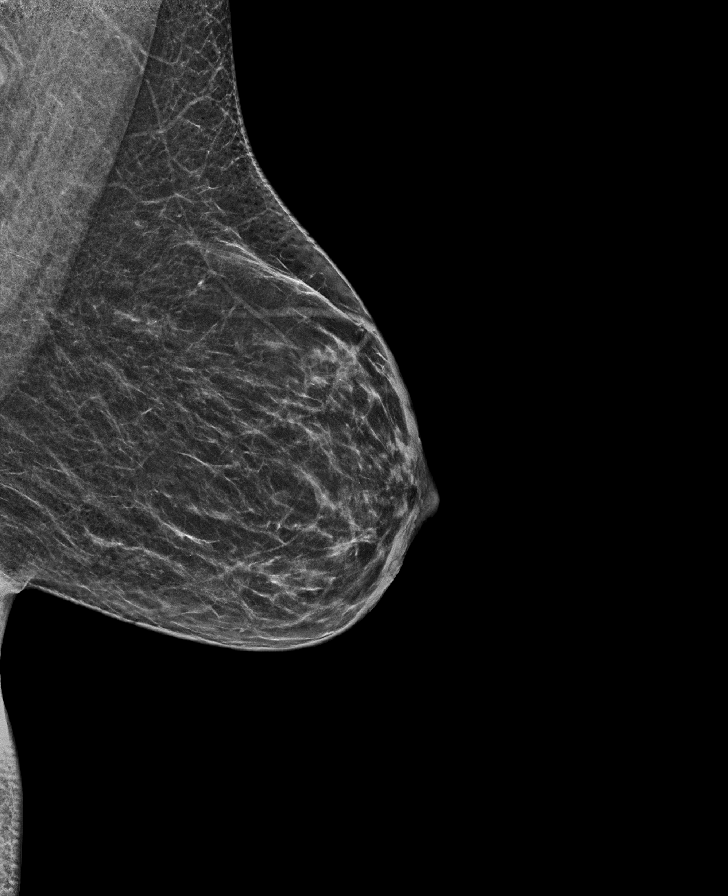

[L CC synth-2D]
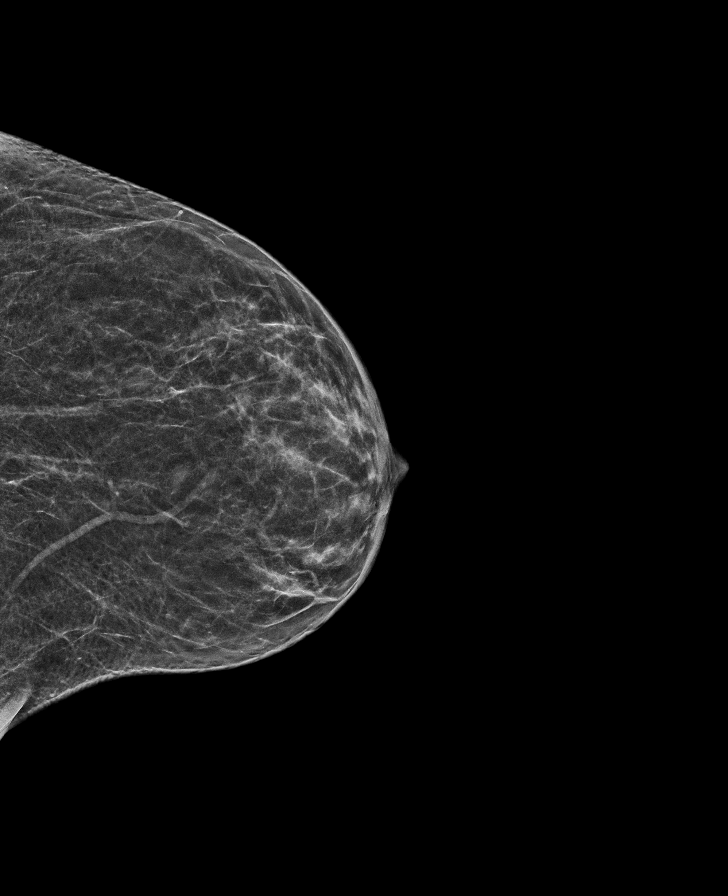

[R MLO tomo · tomo slice 29/57.0]
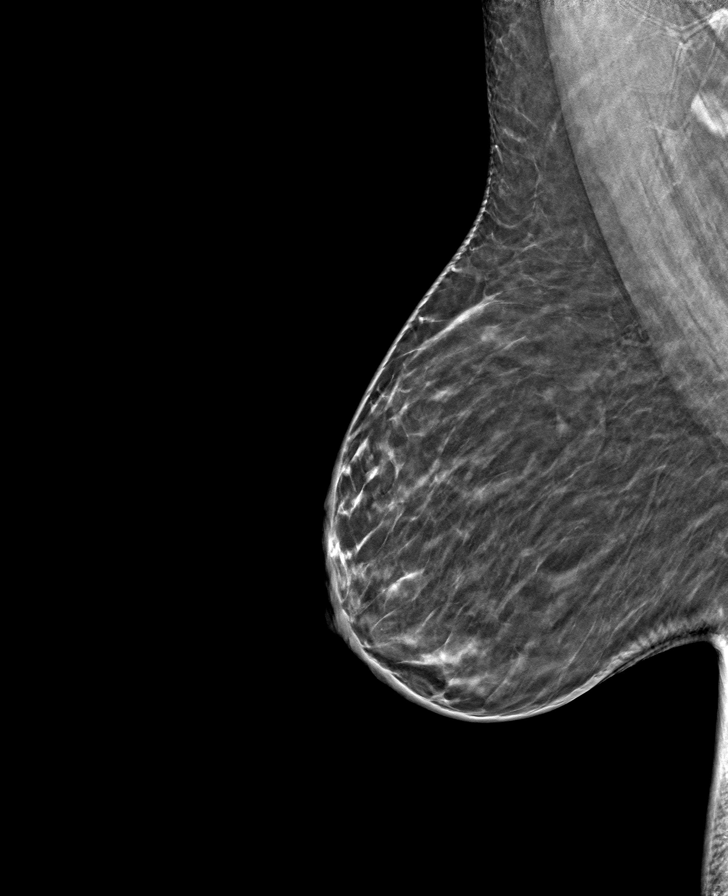

[L MLO tomo · tomo slice 29/57.0]
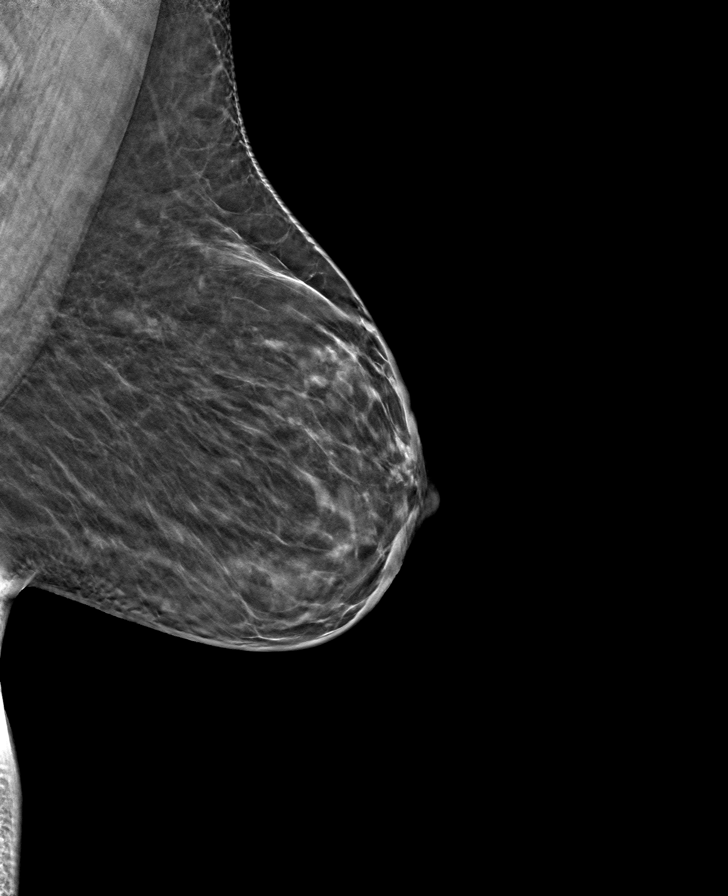

[R CC tomo · tomo slice 27/52.0]
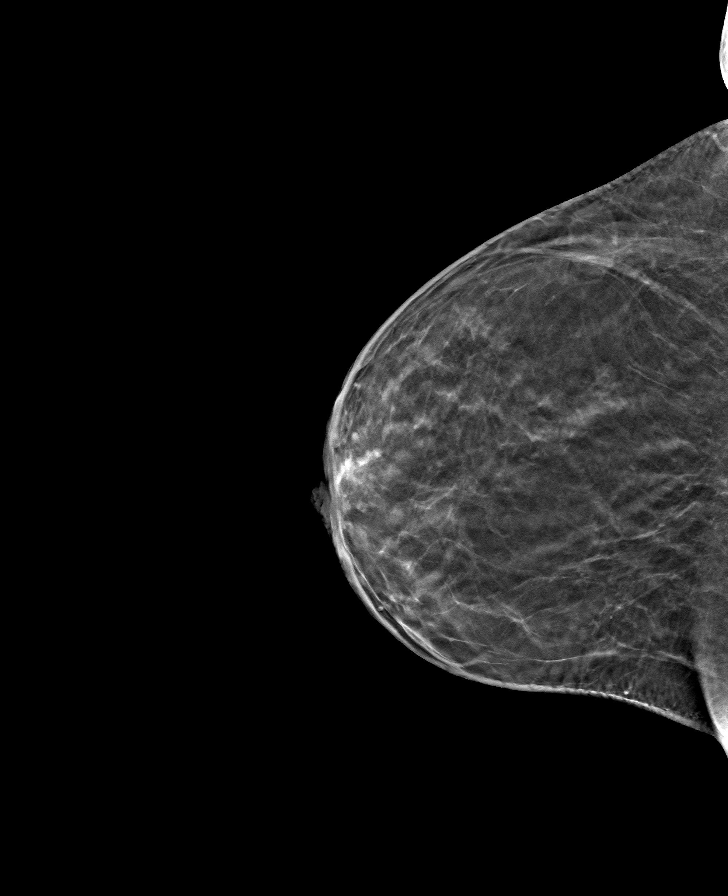

[L CC tomo · tomo slice 27/53.0]
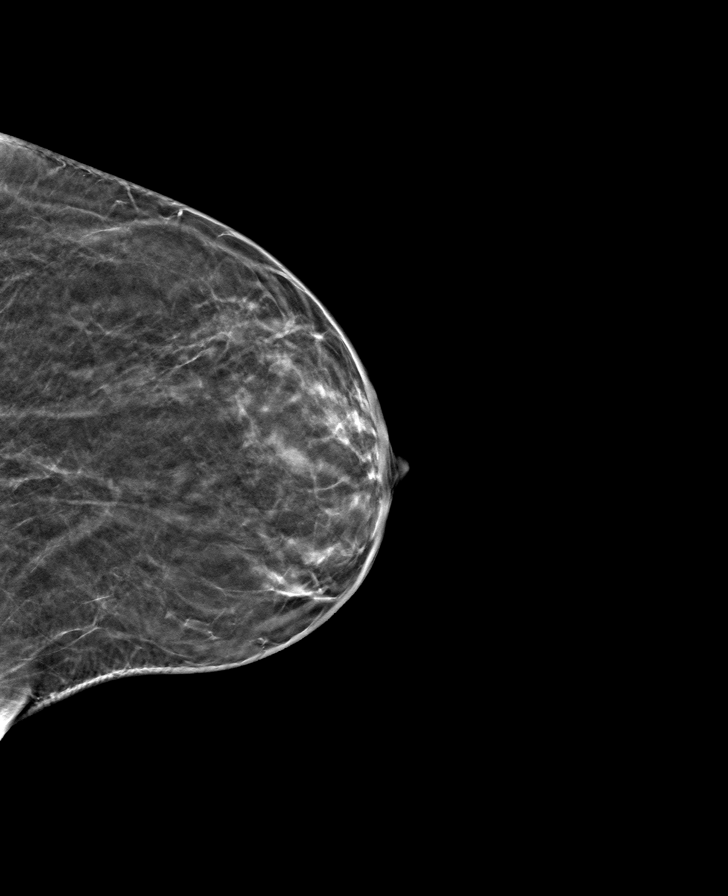

[8 of 24 positions shown; findings below may reference images not displayed]

ACR Breast Density Category b: There are scattered areas of
fibroglandular density.
FINDINGS: There are no findings suspicious for malignancy. The images were
evaluated with computer-aided detection.
IMPRESSION: No mammographic evidence of malignancy. A result letter of this
screening mammogram will be mailed directly to the patient.

RECOMMENDATION:
Screening mammogram in one year. (Code:WJ-I-BG6)

BI-RADS CATEGORY  1: Negative.

## 2023-05-02 ENCOUNTER — Encounter (HOSPITAL_BASED_OUTPATIENT_CLINIC_OR_DEPARTMENT_OTHER): Payer: Self-pay | Admitting: Obstetrics & Gynecology

## 2023-05-03 ENCOUNTER — Other Ambulatory Visit (HOSPITAL_BASED_OUTPATIENT_CLINIC_OR_DEPARTMENT_OTHER): Payer: Self-pay

## 2023-05-05 ENCOUNTER — Other Ambulatory Visit (HOSPITAL_BASED_OUTPATIENT_CLINIC_OR_DEPARTMENT_OTHER): Payer: Self-pay

## 2023-05-16 ENCOUNTER — Other Ambulatory Visit (HOSPITAL_BASED_OUTPATIENT_CLINIC_OR_DEPARTMENT_OTHER): Payer: Self-pay | Admitting: *Deleted

## 2023-05-16 DIAGNOSIS — Z30433 Encounter for removal and reinsertion of intrauterine contraceptive device: Secondary | ICD-10-CM

## 2023-05-16 NOTE — Progress Notes (Signed)
 [place] order for IUD removal/reinsertion entered KD

## 2023-05-17 ENCOUNTER — Ambulatory Visit (HOSPITAL_BASED_OUTPATIENT_CLINIC_OR_DEPARTMENT_OTHER): Admitting: Obstetrics & Gynecology

## 2023-05-17 ENCOUNTER — Ambulatory Visit (HOSPITAL_BASED_OUTPATIENT_CLINIC_OR_DEPARTMENT_OTHER)

## 2023-05-17 VITALS — BP 137/79 | HR 85 | Ht 69.0 in | Wt 241.8 lb

## 2023-05-17 DIAGNOSIS — Z30433 Encounter for removal and reinsertion of intrauterine contraceptive device: Secondary | ICD-10-CM | POA: Diagnosis not present

## 2023-05-17 DIAGNOSIS — T8332XA Displacement of intrauterine contraceptive device, initial encounter: Secondary | ICD-10-CM

## 2023-05-17 DIAGNOSIS — Z30430 Encounter for insertion of intrauterine contraceptive device: Secondary | ICD-10-CM

## 2023-05-17 MED ORDER — LEVONORGESTREL 20 MCG/DAY IU IUD
1.0000 | INTRAUTERINE_SYSTEM | Freq: Once | INTRAUTERINE | Status: AC
  Administered 2023-05-17: 1 via INTRAUTERINE

## 2023-05-20 ENCOUNTER — Encounter (HOSPITAL_BASED_OUTPATIENT_CLINIC_OR_DEPARTMENT_OTHER): Payer: Self-pay | Admitting: Obstetrics & Gynecology

## 2023-05-20 NOTE — Progress Notes (Signed)
 43 y.o. G60P1001 Married Caucasian female presents for removal of Mirena IUD and re-insertion of IUD.  She is planning on using a Mirena IUD again.  Current IUD is malpositioned and she's had some lower pelvic pain and heavier bleeding.  Ultrasound was done and IUD noted in the lower uterus.  Consent is obtained today.  All questions answered prior to start of procedure.   Recent STD testing:  not indicated LMP:  No LMP recorded. (Menstrual status: IUD).  Patient Active Problem List   Diagnosis Date Noted   IUD (intrauterine device) in place 09/24/2020   Smoking 06/14/2019   Migraine without aura    Opioid dependence in remission (HCC) 01/15/2014   Depression affecting pregnancy, postpartum 01/15/2014   Fibroid 05/24/2013   Past Medical History:  Diagnosis Date   Abnormal Pap smear    ASCUS   Abnormal TSH    Anxiety and depression    History of kidney stones    Migraine without aura    Opiate addiction (HCC)    history occasional suboxone   Current Outpatient Medications on File Prior to Visit  Medication Sig Dispense Refill   Buprenorphine HCl-Naloxone HCl 8-2 MG FILM Place 8 mg of opioid under the tongue.     buPROPion (WELLBUTRIN XL) 150 MG 24 hr tablet Take 150 mg by mouth every morning.     ibuprofen (ADVIL,MOTRIN) 600 MG tablet Take 1 tablet (600 mg total) by mouth every 6 (six) hours. 30 tablet 0   lisdexamfetamine (VYVANSE) 50 MG capsule Take 50 mg by mouth daily.     lisdexamfetamine (VYVANSE) 50 MG capsule Take 1 capsule (50 mg total) by mouth daily. 30 capsule 0   lisdexamfetamine (VYVANSE) 50 MG capsule Take 1 capsule (50 mg total) by mouth daily. 30 capsule 0   lisdexamfetamine (VYVANSE) 50 MG capsule Take 1 capsule (50 mg total) by mouth daily. 30 capsule 0   lisdexamfetamine (VYVANSE) 50 MG capsule Take 1 capsule (50 mg total) by mouth daily. 30 capsule 0   lisdexamfetamine (VYVANSE) 50 MG capsule Take 1 capsule (50 mg total) by mouth daily. 30 capsule 0   VIIBRYD 40  MG TABS Take 40 mg by mouth daily.     No current facility-administered medications on file prior to visit.   Paroxetine and Dilaudid [hydromorphone hcl]  Review of Systems  Constitutional: Negative.   Genitourinary:        LLQ pain Heavier menstrual bleeding   Vitals:   05/17/23 1506  BP: 137/79  Pulse: 85  Weight: 241 lb 12.8 oz (109.7 kg)  Height: 5\' 9"  (1.753 m)    Gen:  WNWF healthy female NAD Abdomen: soft, non-tender Groin:  no inguinal nodes palpated  Pelvic exam: Vulva:  normal female genitalia Vagina:  normal vagina Cervix:  Non-tender, Negative CMT, no lesions or redness. Uterus:  normal shape, position and consistency   Procedure:  Speculum reinserted.  Cervix visualized and cleansed with Betadine x 3.  Single toothed tenaculum applied to anterior lip of cervix without difficulty.  IUD string noted and grasped with ringed forcep.  With one pull, IUD removed easily.  Pt has some mild cramping but tolerated this well.  Then uterus sounded to 8cm. UD package was opened.  IUD and introducer passed to fundus and then withdrawn slightly before IUD was passed into endometrial cavity.  Introducer removed.  Strings cut to 2cm.  Tenaculum removed from cervix.  Minimal bleeding noted.  Pt tolerated the procedure well.  All instruments  removed from vagina.  Ultrasound used to confirm IUD was in correct location within endometrial cavity.  Assessment/Plan: 1. Encounter for IUD removal and reinsertion (Primary) - levonorgestrel (MIRENA) 20 MCG/DAY IUD 1 each - Recheck 6-8 weeks - Pt aware to call for any concerns - Pt aware removal due no later than 8 years.  IUD card given to pt.  2. Malpositioned intrauterine device (IUD), initial encounter

## 2023-05-30 ENCOUNTER — Other Ambulatory Visit (HOSPITAL_BASED_OUTPATIENT_CLINIC_OR_DEPARTMENT_OTHER): Payer: Self-pay

## 2023-06-03 ENCOUNTER — Other Ambulatory Visit (HOSPITAL_BASED_OUTPATIENT_CLINIC_OR_DEPARTMENT_OTHER): Payer: Self-pay

## 2023-06-21 ENCOUNTER — Other Ambulatory Visit (HOSPITAL_BASED_OUTPATIENT_CLINIC_OR_DEPARTMENT_OTHER): Payer: Self-pay

## 2023-06-21 MED ORDER — LISDEXAMFETAMINE DIMESYLATE 50 MG PO CAPS
50.0000 mg | ORAL_CAPSULE | Freq: Every day | ORAL | 0 refills | Status: AC
  Filled 2023-07-01: qty 30, 30d supply, fill #0

## 2023-06-21 MED ORDER — LISDEXAMFETAMINE DIMESYLATE 50 MG PO CAPS
ORAL_CAPSULE | ORAL | 0 refills | Status: AC
  Filled 2023-07-29: qty 30, 30d supply, fill #0

## 2023-06-21 MED ORDER — LISDEXAMFETAMINE DIMESYLATE 50 MG PO CAPS
50.0000 mg | ORAL_CAPSULE | Freq: Every day | ORAL | 0 refills | Status: AC
  Filled 2023-09-23: qty 30, 30d supply, fill #0

## 2023-06-30 ENCOUNTER — Other Ambulatory Visit (HOSPITAL_BASED_OUTPATIENT_CLINIC_OR_DEPARTMENT_OTHER): Payer: Self-pay

## 2023-07-01 ENCOUNTER — Other Ambulatory Visit (HOSPITAL_BASED_OUTPATIENT_CLINIC_OR_DEPARTMENT_OTHER): Payer: Self-pay

## 2023-07-17 ENCOUNTER — Ambulatory Visit (HOSPITAL_BASED_OUTPATIENT_CLINIC_OR_DEPARTMENT_OTHER): Admitting: Obstetrics & Gynecology

## 2023-07-17 ENCOUNTER — Encounter (HOSPITAL_BASED_OUTPATIENT_CLINIC_OR_DEPARTMENT_OTHER): Payer: Self-pay | Admitting: Obstetrics & Gynecology

## 2023-07-17 VITALS — BP 139/91 | HR 83 | Ht 69.0 in | Wt 247.0 lb

## 2023-07-17 DIAGNOSIS — Z87891 Personal history of nicotine dependence: Secondary | ICD-10-CM

## 2023-07-17 DIAGNOSIS — G43009 Migraine without aura, not intractable, without status migrainosus: Secondary | ICD-10-CM | POA: Diagnosis not present

## 2023-07-17 DIAGNOSIS — Z975 Presence of (intrauterine) contraceptive device: Secondary | ICD-10-CM | POA: Diagnosis not present

## 2023-07-17 DIAGNOSIS — N926 Irregular menstruation, unspecified: Secondary | ICD-10-CM

## 2023-07-17 NOTE — Progress Notes (Signed)
 GYNECOLOGY  VISIT  CC:   IUD check  HPI: 43 y.o. G13P1001 Married White or Caucasian female here for IUD check.  Pt reports she is having increased headaches since placement.  She is using her ubrelvy more than she has in the past.  No auras.  Followed by Dr. Margie Sheller at [facility].  Does not want IUD removed.  H/o menorrhagia and has done with well Mirena  IUD in the past.  Using this for contraception as well.  Having daily spotting since placement on 4/2.     Past Medical History:  Diagnosis Date   Abnormal Pap smear    ASCUS   Abnormal TSH    Anxiety and depression    History of kidney stones    Migraine without aura    Opiate addiction (HCC)    history occasional suboxone    MEDS:   Current Outpatient Medications on File Prior to Visit  Medication Sig Dispense Refill   Buprenorphine  HCl-Naloxone HCl 8-2 MG FILM Place 8 mg of opioid under the tongue.     buPROPion  (WELLBUTRIN  XL) 150 MG 24 hr tablet Take 150 mg by mouth every morning.     ibuprofen  (ADVIL ,MOTRIN ) 600 MG tablet Take 1 tablet (600 mg total) by mouth every 6 (six) hours. 30 tablet 0   lisdexamfetamine (VYVANSE ) 50 MG capsule Take 50 mg by mouth daily.     lisdexamfetamine (VYVANSE ) 50 MG capsule Take 1 capsule (50 mg total) by mouth daily. 30 capsule 0   lisdexamfetamine (VYVANSE ) 50 MG capsule Take 1 capsule (50 mg total) by mouth daily. 30 capsule 0   lisdexamfetamine (VYVANSE ) 50 MG capsule Take 1 capsule (50 mg total) by mouth daily. 30 capsule 0   lisdexamfetamine (VYVANSE ) 50 MG capsule Take 1 capsule (50 mg total) by mouth daily. 30 capsule 0   lisdexamfetamine (VYVANSE ) 50 MG capsule Take 1 capsule (50 mg total) by mouth daily. 30 capsule 0   [START ON 07/19/2023] lisdexamfetamine (VYVANSE ) 50 MG capsule Take 1 oral capsule once a day 30 capsule 0   [START ON 08/16/2023] lisdexamfetamine (VYVANSE ) 50 MG capsule Take 1 oral capsule once a day 30 capsule 0   VIIBRYD  40 MG TABS Take 40 mg by mouth daily.      No current facility-administered medications on file prior to visit.    ALLERGIES: Paroxetine and Dilaudid [hydromorphone hcl]  SH:  married, non smoker  Review of Systems  Constitutional: Negative.   Genitourinary:        Irregular bleeding    PHYSICAL EXAMINATION:    BP (!) 139/91 (BP Location: Left Arm, Patient Position: Sitting, Cuff Size: Large)   Pulse 83   Ht 5\' 9"  (1.753 m)   Wt 247 lb (112 kg)   BMI 36.48 kg/m     General appearance: alert, cooperative and appears stated age   Pelvic: External genitalia:  no lesions              Urethra:  normal appearing urethra with no masses, tenderness or lesions              Bartholins and Skenes: normal                 Vagina: normal mucosa without prolapse or lesions              Cervix: no lesions, 2.5cm IUD string noted.  Speculum exam performed by Idella Major, NP student.  Chaperone was present for exam.  Assessment/Plan: 1. Irregular  bleeding (Primary) - pt is going to give me an update in 4-6 weeks  2. IUD (intrauterine device) in place  3. Smoking hx  4. Migraine without aura and without status migrainosus, not intractable - pt is going to reach out to neurology about changing treatment as she does want to keep her IUD for now.  Advised I can remove at any time in the future if she changes her mind.

## 2023-07-29 ENCOUNTER — Other Ambulatory Visit (HOSPITAL_BASED_OUTPATIENT_CLINIC_OR_DEPARTMENT_OTHER): Payer: Self-pay

## 2023-08-16 ENCOUNTER — Encounter (HOSPITAL_BASED_OUTPATIENT_CLINIC_OR_DEPARTMENT_OTHER): Payer: Self-pay | Admitting: Obstetrics & Gynecology

## 2023-08-24 ENCOUNTER — Other Ambulatory Visit (HOSPITAL_BASED_OUTPATIENT_CLINIC_OR_DEPARTMENT_OTHER): Payer: Self-pay

## 2023-08-24 MED ORDER — LISDEXAMFETAMINE DIMESYLATE 50 MG PO CAPS
50.0000 mg | ORAL_CAPSULE | Freq: Every day | ORAL | 0 refills | Status: AC
  Filled 2023-08-26: qty 30, 30d supply, fill #0

## 2023-08-26 ENCOUNTER — Other Ambulatory Visit (HOSPITAL_BASED_OUTPATIENT_CLINIC_OR_DEPARTMENT_OTHER): Payer: Self-pay

## 2023-09-21 ENCOUNTER — Other Ambulatory Visit (HOSPITAL_BASED_OUTPATIENT_CLINIC_OR_DEPARTMENT_OTHER): Payer: Self-pay

## 2023-09-21 MED ORDER — LISDEXAMFETAMINE DIMESYLATE 50 MG PO CAPS
50.0000 mg | ORAL_CAPSULE | Freq: Every day | ORAL | 0 refills | Status: AC
  Filled 2023-10-21: qty 30, 30d supply, fill #0

## 2023-09-23 ENCOUNTER — Other Ambulatory Visit (HOSPITAL_BASED_OUTPATIENT_CLINIC_OR_DEPARTMENT_OTHER): Payer: Self-pay

## 2023-09-27 ENCOUNTER — Other Ambulatory Visit (HOSPITAL_BASED_OUTPATIENT_CLINIC_OR_DEPARTMENT_OTHER): Payer: Self-pay

## 2023-09-27 MED ORDER — LISDEXAMFETAMINE DIMESYLATE 50 MG PO CAPS
50.0000 mg | ORAL_CAPSULE | Freq: Every day | ORAL | 0 refills | Status: AC
  Filled 2023-11-18: qty 30, 30d supply, fill #0

## 2023-09-27 MED ORDER — LISDEXAMFETAMINE DIMESYLATE 50 MG PO CAPS: 50.0000 mg | ORAL_CAPSULE | Freq: Every day | ORAL | 0 refills | Status: AC

## 2023-09-27 MED ORDER — LISDEXAMFETAMINE DIMESYLATE 50 MG PO CAPS
50.0000 mg | ORAL_CAPSULE | Freq: Every day | ORAL | 0 refills | Status: AC
  Filled 2023-12-16: qty 30, 30d supply, fill #0

## 2023-10-20 ENCOUNTER — Other Ambulatory Visit (HOSPITAL_BASED_OUTPATIENT_CLINIC_OR_DEPARTMENT_OTHER): Payer: Self-pay

## 2023-10-21 ENCOUNTER — Other Ambulatory Visit (HOSPITAL_BASED_OUTPATIENT_CLINIC_OR_DEPARTMENT_OTHER): Payer: Self-pay

## 2023-11-17 ENCOUNTER — Other Ambulatory Visit (HOSPITAL_BASED_OUTPATIENT_CLINIC_OR_DEPARTMENT_OTHER): Payer: Self-pay

## 2023-11-18 ENCOUNTER — Other Ambulatory Visit (HOSPITAL_BASED_OUTPATIENT_CLINIC_OR_DEPARTMENT_OTHER): Payer: Self-pay

## 2023-12-12 ENCOUNTER — Other Ambulatory Visit (HOSPITAL_BASED_OUTPATIENT_CLINIC_OR_DEPARTMENT_OTHER): Payer: Self-pay

## 2023-12-15 ENCOUNTER — Other Ambulatory Visit (HOSPITAL_BASED_OUTPATIENT_CLINIC_OR_DEPARTMENT_OTHER): Payer: Self-pay

## 2023-12-16 ENCOUNTER — Other Ambulatory Visit (HOSPITAL_BASED_OUTPATIENT_CLINIC_OR_DEPARTMENT_OTHER): Payer: Self-pay

## 2024-01-04 ENCOUNTER — Other Ambulatory Visit (HOSPITAL_BASED_OUTPATIENT_CLINIC_OR_DEPARTMENT_OTHER): Payer: Self-pay

## 2024-01-04 MED ORDER — LISDEXAMFETAMINE DIMESYLATE 50 MG PO CAPS
50.0000 mg | ORAL_CAPSULE | Freq: Every day | ORAL | 0 refills | Status: AC
  Filled 2024-03-09 (×2): qty 30, 30d supply, fill #0

## 2024-01-04 MED ORDER — LISDEXAMFETAMINE DIMESYLATE 50 MG PO CAPS
50.0000 mg | ORAL_CAPSULE | Freq: Every day | ORAL | 0 refills | Status: AC
  Filled 2024-02-10: qty 30, 30d supply, fill #0

## 2024-01-04 MED ORDER — LISDEXAMFETAMINE DIMESYLATE 50 MG PO CAPS
50.0000 mg | ORAL_CAPSULE | Freq: Every day | ORAL | 0 refills | Status: AC
  Filled 2024-01-04 – 2024-01-13 (×3): qty 30, 30d supply, fill #0

## 2024-01-12 ENCOUNTER — Other Ambulatory Visit (HOSPITAL_BASED_OUTPATIENT_CLINIC_OR_DEPARTMENT_OTHER): Payer: Self-pay

## 2024-01-13 ENCOUNTER — Other Ambulatory Visit (HOSPITAL_BASED_OUTPATIENT_CLINIC_OR_DEPARTMENT_OTHER): Payer: Self-pay

## 2024-02-09 ENCOUNTER — Other Ambulatory Visit (HOSPITAL_BASED_OUTPATIENT_CLINIC_OR_DEPARTMENT_OTHER): Payer: Self-pay

## 2024-02-10 ENCOUNTER — Other Ambulatory Visit (HOSPITAL_BASED_OUTPATIENT_CLINIC_OR_DEPARTMENT_OTHER): Payer: Self-pay

## 2024-03-08 ENCOUNTER — Other Ambulatory Visit (HOSPITAL_BASED_OUTPATIENT_CLINIC_OR_DEPARTMENT_OTHER): Payer: Self-pay

## 2024-03-09 ENCOUNTER — Other Ambulatory Visit (HOSPITAL_BASED_OUTPATIENT_CLINIC_OR_DEPARTMENT_OTHER): Payer: Self-pay
